# Patient Record
Sex: Male | Born: 1970
Health system: Southern US, Community
[De-identification: ages and names within clinical notes are randomized; demographics above are authoritative.]

## PROBLEM LIST (undated history)

## (undated) DIAGNOSIS — T7840XA Allergy, unspecified, initial encounter: Secondary | ICD-10-CM

## (undated) DIAGNOSIS — I4891 Unspecified atrial fibrillation: Secondary | ICD-10-CM

## (undated) DIAGNOSIS — C4491 Basal cell carcinoma of skin, unspecified: Secondary | ICD-10-CM

## (undated) DIAGNOSIS — K4090 Unilateral inguinal hernia, without obstruction or gangrene, not specified as recurrent: Secondary | ICD-10-CM

## (undated) DIAGNOSIS — E785 Hyperlipidemia, unspecified: Secondary | ICD-10-CM

## (undated) HISTORY — PX: BASAL CELL CARCINOMA EXCISION: SHX1214

## (undated) HISTORY — PX: WISDOM TOOTH EXTRACTION: SHX21

## (undated) HISTORY — DX: Basal cell carcinoma of skin, unspecified: C44.91

## (undated) HISTORY — DX: Unspecified atrial fibrillation: I48.91

## (undated) HISTORY — DX: Hyperlipidemia, unspecified: E78.5

## (undated) HISTORY — DX: Allergy, unspecified, initial encounter: T78.40XA

## (undated) HISTORY — DX: Unilateral inguinal hernia, without obstruction or gangrene, not specified as recurrent: K40.90

---

## 2004-06-07 ENCOUNTER — Ambulatory Visit: Payer: Self-pay | Admitting: Internal Medicine

## 2005-06-06 ENCOUNTER — Ambulatory Visit: Payer: Self-pay | Admitting: Internal Medicine

## 2005-10-04 ENCOUNTER — Ambulatory Visit: Payer: Self-pay | Admitting: Internal Medicine

## 2006-10-03 ENCOUNTER — Ambulatory Visit: Payer: Self-pay | Admitting: Internal Medicine

## 2007-01-19 ENCOUNTER — Encounter: Payer: Self-pay | Admitting: Internal Medicine

## 2007-01-19 ENCOUNTER — Ambulatory Visit: Payer: Self-pay | Admitting: Internal Medicine

## 2007-01-30 ENCOUNTER — Telehealth: Payer: Self-pay | Admitting: Internal Medicine

## 2007-01-31 ENCOUNTER — Ambulatory Visit: Payer: Self-pay | Admitting: Internal Medicine

## 2007-01-31 LAB — CONVERTED CEMR LAB
ALT: 14 units/L (ref 0–53)
AST: 17 units/L (ref 0–37)
Albumin: 4.1 g/dL (ref 3.5–5.2)
Alkaline Phosphatase: 44 units/L (ref 39–117)
BUN: 11 mg/dL (ref 6–23)
Basophils Absolute: 0 10*3/uL (ref 0.0–0.1)
Basophils Relative: 0.2 % (ref 0.0–1.0)
Bilirubin Urine: NEGATIVE
Bilirubin, Direct: 0.1 mg/dL (ref 0.0–0.3)
Blood in Urine, dipstick: NEGATIVE
CO2: 33 meq/L — ABNORMAL HIGH (ref 19–32)
Calcium: 9.3 mg/dL (ref 8.4–10.5)
Chloride: 104 meq/L (ref 96–112)
Cholesterol: 178 mg/dL (ref 0–200)
Creatinine, Ser: 0.8 mg/dL (ref 0.4–1.5)
Eosinophils Absolute: 0.2 10*3/uL (ref 0.0–0.6)
Eosinophils Relative: 4.4 % (ref 0.0–5.0)
GFR calc Af Amer: 141 mL/min
GFR calc non Af Amer: 116 mL/min
Glucose, Bld: 86 mg/dL (ref 70–99)
Glucose, Urine, Semiquant: NEGATIVE
HCT: 41.9 % (ref 39.0–52.0)
HDL: 51.3 mg/dL (ref >39.0)
Hemoglobin: 14.4 g/dL (ref 13.0–17.0)
Ketones, urine, test strip: NEGATIVE
LDL Cholesterol: 113 mg/dL — ABNORMAL HIGH (ref 0–99)
Lymphocytes Relative: 28.8 % (ref 12.0–46.0)
MCHC: 34.5 g/dL (ref 30.0–36.0)
MCV: 85.5 fL (ref 78.0–100.0)
Monocytes Absolute: 0.4 10*3/uL (ref 0.2–0.7)
Monocytes Relative: 7.6 % (ref 3.0–11.0)
Neutro Abs: 3.1 10*3/uL (ref 1.4–7.7)
Neutrophils Relative %: 59 % (ref 43.0–77.0)
Nitrite: NEGATIVE
Platelets: 288 10*3/uL (ref 150–400)
Potassium: 3.9 meq/L (ref 3.5–5.1)
Protein, U semiquant: NEGATIVE
RBC: 4.9 M/uL (ref 4.22–5.81)
RDW: 12.4 % (ref 11.5–14.6)
Sodium: 141 meq/L (ref 135–145)
Specific Gravity, Urine: 1.02
TSH: 0.93 microintl units/mL (ref 0.35–5.50)
Total Bilirubin: 0.6 mg/dL (ref 0.3–1.2)
Total CHOL/HDL Ratio: 3.5
Total Protein: 7 g/dL (ref 6.0–8.3)
Triglycerides: 69 mg/dL (ref 0–149)
Urobilinogen, UA: 0.2
VLDL: 14 mg/dL (ref 0–40)
WBC Urine, dipstick: NEGATIVE
WBC: 5.2 10*3/uL (ref 4.5–10.5)
pH: 6

## 2007-02-09 ENCOUNTER — Ambulatory Visit: Payer: Self-pay | Admitting: Internal Medicine

## 2008-02-02 ENCOUNTER — Telehealth: Payer: Self-pay | Admitting: Family Medicine

## 2008-04-02 ENCOUNTER — Ambulatory Visit: Payer: Self-pay | Admitting: Internal Medicine

## 2009-04-22 ENCOUNTER — Ambulatory Visit: Payer: Self-pay | Admitting: Internal Medicine

## 2009-06-03 ENCOUNTER — Telehealth: Payer: Self-pay | Admitting: Internal Medicine

## 2009-06-10 ENCOUNTER — Ambulatory Visit: Payer: Self-pay | Admitting: Internal Medicine

## 2009-06-10 DIAGNOSIS — R6882 Decreased libido: Secondary | ICD-10-CM | POA: Insufficient documentation

## 2009-06-16 LAB — CONVERTED CEMR LAB
AST: 21 units/L (ref 0–37)
Albumin: 4.3 g/dL (ref 3.5–5.2)
Basophils Absolute: 0 10*3/uL (ref 0.0–0.1)
CO2: 31 meq/L (ref 19–32)
Chloride: 105 meq/L (ref 96–112)
Eosinophils Absolute: 0.2 10*3/uL (ref 0.0–0.7)
Glucose, Bld: 87 mg/dL (ref 70–99)
Hemoglobin: 14.8 g/dL (ref 13.0–17.0)
Lymphocytes Relative: 34.7 % (ref 12.0–46.0)
Lymphs Abs: 1.7 10*3/uL (ref 0.7–4.0)
MCHC: 33.4 g/dL (ref 30.0–36.0)
Monocytes Relative: 8.2 % (ref 3.0–12.0)
Neutro Abs: 2.6 10*3/uL (ref 1.4–7.7)
Platelets: 235 10*3/uL (ref 150.0–400.0)
RDW: 12.2 % (ref 11.5–14.6)
Sodium: 142 meq/L (ref 135–145)
Total Bilirubin: 0.8 mg/dL (ref 0.3–1.2)

## 2009-06-17 ENCOUNTER — Telehealth: Payer: Self-pay | Admitting: Internal Medicine

## 2010-09-10 ENCOUNTER — Other Ambulatory Visit (INDEPENDENT_AMBULATORY_CARE_PROVIDER_SITE_OTHER): Payer: BC Managed Care – PPO | Admitting: Internal Medicine

## 2010-09-10 DIAGNOSIS — Z0000 Encounter for general adult medical examination without abnormal findings: Secondary | ICD-10-CM

## 2010-09-10 LAB — LIPID PANEL
Cholesterol: 179 mg/dL (ref 0–200)
Triglycerides: 43 mg/dL (ref 0.0–149.0)
VLDL: 8.6 mg/dL (ref 0.0–40.0)

## 2010-09-10 LAB — BASIC METABOLIC PANEL
BUN: 13 mg/dL (ref 6–23)
CO2: 30 mEq/L (ref 19–32)
Chloride: 105 mEq/L (ref 96–112)
Glucose, Bld: 88 mg/dL (ref 70–99)
Potassium: 4.6 mEq/L (ref 3.5–5.1)
Sodium: 142 mEq/L (ref 135–145)

## 2010-09-10 LAB — CBC WITH DIFFERENTIAL/PLATELET
Basophils Absolute: 0 10*3/uL (ref 0.0–0.1)
Eosinophils Absolute: 0.1 10*3/uL (ref 0.0–0.7)
HCT: 40.8 % (ref 39.0–52.0)
Hemoglobin: 14 g/dL (ref 13.0–17.0)
Lymphs Abs: 1.3 10*3/uL (ref 0.7–4.0)
MCHC: 34.3 g/dL (ref 30.0–36.0)
MCV: 87.7 fl (ref 78.0–100.0)
Monocytes Absolute: 0.5 10*3/uL (ref 0.1–1.0)
Monocytes Relative: 8.7 % (ref 3.0–12.0)
Neutro Abs: 4.2 10*3/uL (ref 1.4–7.7)
Platelets: 256 10*3/uL (ref 150.0–400.0)
RDW: 13.8 % (ref 11.5–14.6)

## 2010-09-10 LAB — HEPATIC FUNCTION PANEL
AST: 19 U/L (ref 0–37)
Albumin: 4.5 g/dL (ref 3.5–5.2)

## 2010-09-10 LAB — POCT URINALYSIS DIPSTICK
Blood, UA: NEGATIVE
Ketones, UA: NEGATIVE
Protein, UA: NEGATIVE
Spec Grav, UA: 1.01

## 2010-09-10 LAB — TSH: TSH: 0.93 u[IU]/mL (ref 0.35–5.50)

## 2010-09-23 ENCOUNTER — Encounter: Payer: Self-pay | Admitting: Internal Medicine

## 2010-09-27 ENCOUNTER — Encounter: Payer: Self-pay | Admitting: Internal Medicine

## 2010-09-27 ENCOUNTER — Ambulatory Visit (INDEPENDENT_AMBULATORY_CARE_PROVIDER_SITE_OTHER): Payer: BC Managed Care – PPO | Admitting: Internal Medicine

## 2010-09-27 VITALS — BP 122/84 | HR 72 | Temp 98.6°F | Ht 72.0 in | Wt 175.0 lb

## 2010-09-27 DIAGNOSIS — Z0000 Encounter for general adult medical examination without abnormal findings: Secondary | ICD-10-CM

## 2010-09-27 DIAGNOSIS — R6882 Decreased libido: Secondary | ICD-10-CM

## 2010-09-27 LAB — FOLLICLE STIMULATING HORMONE: FSH: 4.9 m[IU]/mL (ref 1.4–18.1)

## 2010-09-27 NOTE — Progress Notes (Signed)
  Subjective:    Patient ID: Casey Zavala, male    DOB: January 26, 1971, 40 y.o.   MRN: [id number]  HPI cpx  No past medical history on file. Past Surgical History  Procedure Date  . Basal cell carcinoma excision     reports that he has never smoked. He does not have any smokeless tobacco history on file. He reports that he drinks alcohol. He reports that he does not use illicit drugs. family history includes Depression in his sister; Diabetes in his brother and mother; Hypothyroidism in his mother; and Stroke in his father. No Known Allergies   Review of Systems  patient denies chest pain, shortness of breath, orthopnea. Denies lower extremity edema, abdominal pain, change in appetite, change in bowel movements. Patient denies rashes, musculoskeletal complaints. No other specific complaints in a complete review of systems.  Still has decreased libido.     Objective:   Physical Exam Well-developed male in no acute distress. HEENT exam atraumatic, normocephalic, extraocular muscles are intact. Conjunctivae are pink without exudate. Neck is supple without lymphadenopathy, thyromegaly, jugular venous distention. Chest is clear to auscultation without increased work of breathing. Cardiac exam S1-S2 are regular. The PMI is normal. No significant murmurs or gallops. Abdominal exam active bowel sounds, soft, nontender. No abdominal bruits. Extremities no clubbing cyanosis or edema. Peripheral pulses are normal without bruits. Neurologic exam alert and oriented without any motor or sensory deficits.  GU: nl male, circumcised. No hernia, testicular exam normal      Assessment & Plan:  Well visit Health maint UTD  Decreased libido---check labs today---testosterone previously normal

## 2011-05-31 ENCOUNTER — Ambulatory Visit (INDEPENDENT_AMBULATORY_CARE_PROVIDER_SITE_OTHER): Payer: BC Managed Care – PPO | Admitting: Internal Medicine

## 2011-05-31 DIAGNOSIS — Z23 Encounter for immunization: Secondary | ICD-10-CM

## 2012-12-03 ENCOUNTER — Other Ambulatory Visit (INDEPENDENT_AMBULATORY_CARE_PROVIDER_SITE_OTHER): Payer: BC Managed Care – PPO

## 2012-12-03 DIAGNOSIS — Z0000 Encounter for general adult medical examination without abnormal findings: Secondary | ICD-10-CM

## 2012-12-03 LAB — CBC WITH DIFFERENTIAL/PLATELET
Basophils Absolute: 0 10*3/uL (ref 0.0–0.1)
Hemoglobin: 13 g/dL (ref 13.0–17.0)
Lymphocytes Relative: 32.6 % (ref 12.0–46.0)
Monocytes Relative: 8.2 % (ref 3.0–12.0)
Platelets: 247 10*3/uL (ref 150.0–400.0)
RDW: 13.5 % (ref 11.5–14.6)

## 2012-12-03 LAB — POCT URINALYSIS DIPSTICK
Bilirubin, UA: NEGATIVE
Blood, UA: NEGATIVE
Glucose, UA: NEGATIVE
Spec Grav, UA: 1.015

## 2012-12-03 LAB — LIPID PANEL
HDL: 63.4 mg/dL (ref >39.00)
LDL Cholesterol: 117 mg/dL — ABNORMAL HIGH (ref 0–99)
Total CHOL/HDL Ratio: 3
Triglycerides: 53 mg/dL (ref 0.0–149.0)

## 2012-12-03 LAB — HEPATIC FUNCTION PANEL
AST: 17 U/L (ref 0–37)
Alkaline Phosphatase: 39 U/L (ref 39–117)
Bilirubin, Direct: 0 mg/dL (ref 0.0–0.3)
Total Bilirubin: 0.4 mg/dL (ref 0.3–1.2)

## 2012-12-03 LAB — BASIC METABOLIC PANEL
BUN: 12 mg/dL (ref 6–23)
Calcium: 9.2 mg/dL (ref 8.4–10.5)
GFR: 103.56 mL/min (ref >60.00)
Glucose, Bld: 85 mg/dL (ref 70–99)
Sodium: 139 mEq/L (ref 135–145)

## 2012-12-10 ENCOUNTER — Encounter: Payer: Self-pay | Admitting: Internal Medicine

## 2012-12-10 ENCOUNTER — Ambulatory Visit (INDEPENDENT_AMBULATORY_CARE_PROVIDER_SITE_OTHER): Payer: BC Managed Care – PPO | Admitting: Internal Medicine

## 2012-12-10 VITALS — BP 114/70 | HR 68 | Temp 98.1°F | Ht 71.5 in | Wt 172.0 lb

## 2012-12-10 DIAGNOSIS — Z0000 Encounter for general adult medical examination without abnormal findings: Secondary | ICD-10-CM

## 2012-12-10 MED ORDER — TRIAMCINOLONE ACETONIDE 0.5 % EX OINT: TOPICAL_OINTMENT | Freq: Two times a day (BID) | CUTANEOUS | Status: DC

## 2012-12-10 NOTE — Progress Notes (Signed)
Patient ID: Casey Zavala, male   DOB: 1970-07-26, 42 y.o.   MRN: [id number] cpx  No past medical history on file.  History   Social History  . Marital Status: Married    Spouse Name: N/A    Number of Children: N/A  . Years of Education: N/A   Occupational History  . Not on file.   Social History Main Topics  . Smoking status: Never Smoker   . Smokeless tobacco: Not on file  . Alcohol Use: Yes  . Drug Use: No  . Sexually Active: Not on file   Other Topics Concern  . Not on file   Social History Narrative  . No narrative on file    Past Surgical History  Procedure Laterality Date  . Basal cell carcinoma excision      Family History  Problem Relation Age of Onset  . Stroke Father   . Hypothyroidism Mother   . Diabetes Mother   . Depression Sister   . Diabetes Brother   . Schizophrenia Brother   . Kidney disease Brother     No Known Allergies  No current outpatient prescriptions on file prior to visit.   No current facility-administered medications on file prior to visit.     patient denies chest pain, shortness of breath, orthopnea. Denies lower extremity edema, abdominal pain, change in appetite, change in bowel movements. Patient denies rashes, musculoskeletal complaints. No other specific complaints in a complete review of systems.   BP 114/70  Pulse 68  Temp(Src) 98.1 F (36.7 C) (Oral)  Ht 5' 11.5" (1.816 m)  Wt 172 lb (78.019 kg)  BMI 23.66 kg/m2  well-developed well-nourished male in no acute distress. HEENT exam atraumatic, normocephalic, neck supple without jugular venous distention. Chest clear to auscultation cardiac exam S1-S2 are regular. Abdominal exam overweight with bowel sounds, soft and nontender. Extremities no edema. Neurologic exam is alert with a normal gait.  Well visit- health maint UTD

## 2013-10-31 ENCOUNTER — Telehealth: Payer: Self-pay | Admitting: Internal Medicine

## 2013-10-31 NOTE — Telephone Encounter (Signed)
Patient Information:  Caller Name: [name]  Phone: [phone]  Patient: Casey Zavala, Casey Zavala  Gender: Male  DOB: [birth year]  Age: 43 Years  PCP: Phoebe Sharps (Adults only)  Office Follow Up:  Does the office need to follow up with this patient?: No  Instructions For The Office: N/A  RN Note:  Poison Ivy, Neck, Forehead, Chin, Fingers, Torso, Arms, Waist, onset 5-10. Pt was in [place] over the w/e, thought rash/bumps were insect bites and has been scratching, rash spreading what sounds like Poison Ivy. No improvement after Triamcinolone x4 days. Poison Ivy protocol used, see today.  No appts at Office or [place] office, Pt does not wish to drive to any further locations.  Pt will go to Urgent Care in [facility] close to his employment.    Symptoms  Reason For Call & Symptoms: Poison Ivy, Neck, Forehead, Chin, Fingers, Torso, Arms, Waist, onset 5-10.  Reviewed Health History In EMR: Yes  Reviewed Medications In EMR: Yes  Reviewed Allergies In EMR: Yes  Reviewed Surgeries / Procedures: Yes  Date of Onset of Symptoms: 10/27/2013  Treatments Tried: Triamcinolone cream  Treatments Tried Worked: No  Guideline(s) Used:  Poison Ivy - [employer] or [employer]  Disposition Per Guideline:   See Today in Office  Reason For Disposition Reached:   Rash involves more than one fourth of the body  Advice Given:  N/A  Patient Will Follow Care Advice:  YES

## 2013-10-31 NOTE — Telephone Encounter (Signed)
Noted  

## 2013-11-20 ENCOUNTER — Encounter: Payer: Self-pay | Admitting: Family Medicine

## 2013-11-20 ENCOUNTER — Telehealth: Payer: Self-pay | Admitting: Internal Medicine

## 2013-11-20 ENCOUNTER — Ambulatory Visit (INDEPENDENT_AMBULATORY_CARE_PROVIDER_SITE_OTHER): Payer: BC Managed Care – PPO | Admitting: Family Medicine

## 2013-11-20 VITALS — BP 126/80 | HR 86 | Temp 98.5°F | Wt 178.0 lb

## 2013-11-20 DIAGNOSIS — T63441A Toxic effect of venom of bees, accidental (unintentional), initial encounter: Secondary | ICD-10-CM

## 2013-11-20 DIAGNOSIS — T6391XA Toxic effect of contact with unspecified venomous animal, accidental (unintentional), initial encounter: Secondary | ICD-10-CM

## 2013-11-20 DIAGNOSIS — T63461A Toxic effect of venom of wasps, accidental (unintentional), initial encounter: Secondary | ICD-10-CM

## 2013-11-20 MED ORDER — TRIAMCINOLONE ACETONIDE 0.5 % EX OINT: TOPICAL_OINTMENT | Freq: Two times a day (BID) | CUTANEOUS | Status: DC

## 2013-11-20 NOTE — Progress Notes (Signed)
Pre visit review using our clinic review tool, if applicable. No additional management support is needed unless otherwise documented below in the visit note. 

## 2013-11-20 NOTE — Telephone Encounter (Signed)
Noted  

## 2013-11-20 NOTE — Progress Notes (Signed)
   Subjective:    Patient ID: Casey Zavala, male    DOB: 1970/10/07, 43 y.o.   MRN: [id number]  HPI Acute visit. Wasp sting this past Monday. He had some localized redness and swelling initially but this had progressed by today. He was concerned about possible cellulitis. However, he has not had any fever or chills. No pain. Significant pruritus. Took a couple Benadryl. No icing. No generalized rash.  No past medical history on file. Past Surgical History  Procedure Laterality Date  . Basal cell carcinoma excision      reports that he has never smoked. He does not have any smokeless tobacco history on file. He reports that he drinks alcohol. He reports that he does not use illicit drugs. family history includes Depression in his sister; Diabetes in his brother and mother; Hypothyroidism in his mother; Kidney disease in his brother; Schizophrenia in his brother; Stroke in his father. No Known Allergies    Review of Systems  Constitutional: Negative for fever and chills.  Respiratory: Negative for shortness of breath.   Skin: Positive for rash.  Hematological: Negative for adenopathy.       Objective:   Physical Exam  Constitutional: He appears well-developed and well-nourished.  Cardiovascular: Normal rate.   Pulmonary/Chest: Effort normal and breath sounds normal.  Skin:  Patient has mild erythema involving flexor surface of left forearm most of forearm. This is nontender. Slightly warm to touch and slightly swollen.          Assessment & Plan:  Bee sting with localized allergic response. He does not have any fever, chills, or tenderness to suggest cellulitis. Continue Benadryl. Consider icing. Offered prednisone but he's had intolerance to person not to take systemic steroid

## 2013-11-20 NOTE — Patient Instructions (Signed)
Continue with antihistamine  Follow up for any fever, chills, or progressive redness or swelling- or any pain.

## 2013-11-20 NOTE — Telephone Encounter (Signed)
Patient Information:  Caller Name: [name]  Phone: [phone]  Patient: Casey Zavala, Casey Zavala  Gender: Male  DOB: [birth year]  Age: 43 Years  PCP: Phoebe Sharps (Adults only)  Office Follow Up:  Does the office need to follow up with this patient?: No  Instructions For The Office: N/A  RN Note:  Patient states he was stung by wasp on his left forearm 11/18/13. States redness and swelling have gradually increased. Patient states swelling and redness now extend from wrist to elbow 11/20/13. States area is warm to touch and feels tight. States he noted a "small blister," approx. the size of a pencil tip, at area of bite. States area opened in the shower 11/20/13. Denies purulent drainage. Afebrile. Care advice given per guidelines. Call back parameters reviewed. Patient verbalizes understanding. Appt. scheduled, per [place] in office,  at 1415 11/20/13 with Dr. Elease Hashimoto.  Symptoms  Reason For Call & Symptoms: Wasp sting Left forearm  Reviewed Health History In EMR: Yes  Reviewed Medications In EMR: Yes  Reviewed Allergies In EMR: Yes  Reviewed Surgeries / Procedures: Yes  Date of Onset of Symptoms: 11/18/2013  Treatments Tried: Benadryl  Treatments Tried Worked: No  Guideline(s) Used:  Clinical biochemist  Disposition Per Guideline:   See Today in Office  Reason For Disposition Reached:   Red or very tender (to touch) area, and started over 24 hours after the sting  Advice Given:  Antibiotic Ointment:  Apply an antibiotic ointment 3 times per day.  Patient Will Follow Care Advice:  YES  Appointment Scheduled:  11/20/2013 14:15:00 Appointment Scheduled Provider:  Carolann Littler [facility])

## 2014-01-17 ENCOUNTER — Encounter: Payer: Self-pay | Admitting: Family Medicine

## 2014-01-17 ENCOUNTER — Ambulatory Visit (INDEPENDENT_AMBULATORY_CARE_PROVIDER_SITE_OTHER): Payer: BC Managed Care – PPO | Admitting: Family Medicine

## 2014-01-17 VITALS — BP 108/80 | HR 93 | Temp 98.2°F | Ht 71.5 in | Wt 181.0 lb

## 2014-01-17 DIAGNOSIS — K4091 Unilateral inguinal hernia, without obstruction or gangrene, recurrent: Secondary | ICD-10-CM

## 2014-01-17 DIAGNOSIS — K4090 Unilateral inguinal hernia, without obstruction or gangrene, not specified as recurrent: Secondary | ICD-10-CM | POA: Insufficient documentation

## 2014-01-17 NOTE — Progress Notes (Signed)
Pre visit review using our clinic review tool, if applicable. No additional management support is needed unless otherwise documented below in the visit note. 

## 2014-01-17 NOTE — Progress Notes (Signed)
  Garret Reddish, MD Phone: [phone]  Subjective:   Casey Zavala is a 43 y.o. year old very pleasant male patient who presents with the following:  Left Groin Bulge Patient since April has noted some mild discomfort in his left groin. This past Sunday he was lifting something and felt a sudden discomfort and looked down and noted a bulge in his groin. Area located just to the left and slightly above his penis. He tried to push it in but could not get it to go. Later that day he noted the area was no longer bulging out. He has noted it a few times since then. Pain rated as a mild ache that improved when are no longer bulging . ROS- no changes in bowel or bladder. No fever/chills/redness. No nausea/vomiting. No pain with hip movement.   Past Medical History-previously healthy, mild hyperlipidemia with LDL 110-120.  Medications- no current meds   Objective: BP 108/80  Pulse 93  Temp(Src) 98.2 F (36.8 C) (Oral)  Ht 5' 11.5" (1.816 m)  Wt 181 lb (82.101 kg)  BMI 24.90 kg/m2  SpO2 96% Gen: NAD, resting comfortably on table CV: RRR no murmurs rubs or gallops Lungs: CTAB no crackles, wheeze, rhonchi Groin: no palpable bulge with increased abdominal pressure (bearing down or coughing)  Assessment/Plan:  Left groin hernia Could not palpate on exam today. History consistent with surgery. Discussed imaging vs. Surgery referral and patient would prefer surgery referral with their consideration of imaging after repeat exam. Discussed warning signs to present to care such as inability to reduce hernia or nausea/vomiting or bowel changes.    Orders Placed This Encounter  Procedures  . Ambulatory referral to General Surgery    Referral Priority:  Routine    Referral Type:  Surgical    Referral Reason:  Specialty Services Required    Requested Specialty:  General Surgery    Number of Visits Requested:  1

## 2014-01-17 NOTE — Patient Instructions (Signed)
I could not detect your hernia on exam today. We discussed CT through [place] and referral to surgery vs. Surgery referral and repeat exam as well as them deciding on imaging. Decision made to refer to surgery at this time. Our office will call you with appointment. I will clear you for surgery if need be but you have no risk factors for cardiac risk except mild cholesterol elevation.

## 2014-01-17 NOTE — Assessment & Plan Note (Signed)
Could not palpate on exam today. History consistent with surgery. Discussed imaging vs. Surgery referral and patient would prefer surgery referral with their consideration of imaging after repeat exam. Discussed warning signs to present to care such as inability to reduce hernia or nausea/vomiting or bowel changes.

## 2014-02-04 ENCOUNTER — Ambulatory Visit (INDEPENDENT_AMBULATORY_CARE_PROVIDER_SITE_OTHER): Payer: BC Managed Care – PPO | Admitting: General Surgery

## 2014-02-12 ENCOUNTER — Encounter (INDEPENDENT_AMBULATORY_CARE_PROVIDER_SITE_OTHER): Payer: Self-pay | Admitting: General Surgery

## 2014-02-12 ENCOUNTER — Ambulatory Visit (INDEPENDENT_AMBULATORY_CARE_PROVIDER_SITE_OTHER): Payer: BC Managed Care – PPO | Admitting: General Surgery

## 2014-02-12 VITALS — BP 122/80 | HR 74 | Temp 97.2°F | Ht 71.0 in | Wt 178.0 lb

## 2014-02-12 DIAGNOSIS — R109 Unspecified abdominal pain: Secondary | ICD-10-CM

## 2014-02-12 DIAGNOSIS — R1032 Left lower quadrant pain: Secondary | ICD-10-CM

## 2014-02-12 NOTE — Progress Notes (Signed)
Patient ID: Casey Zavala, male   DOB: 12-11-1970, 43 y.o.   MRN: [id number]  Chief Complaint  Patient presents with  . eval hernia    HPI Casey Zavala is a 43 y.o. male.  He the patient is a 43 year old male who is referred by Dr. Yong Channel for evaluation of left inguinal pain. The patient states that he noticed some discomfort his left inguinal area while lifting heavy things. He states he notes no pain while working out. He has noticed no bulge recently.  HPI  Past Medical History  Diagnosis Date  . Cancer     Past Surgical History  Procedure Laterality Date  . Basal cell carcinoma excision      Family History  Problem Relation Age of Onset  . Stroke Father   . Hypothyroidism Mother   . Diabetes Mother   . Depression Sister   . Diabetes Brother   . Schizophrenia Brother   . Kidney disease Brother     Social History History  Substance Use Topics  . Smoking status: Never Smoker   . Smokeless tobacco: Not on file  . Alcohol Use: Yes    No Known Allergies  Current Outpatient Prescriptions  Medication Sig Dispense Refill  . triamcinolone ointment (KENALOG) 0.5 % Apply topically 2 (two) times daily.  30 g  3   No current facility-administered medications for this visit.    Review of Systems Review of Systems  Constitutional: Negative.   HENT: Negative.   Eyes: Negative.   Respiratory: Negative.   Cardiovascular: Negative.   Gastrointestinal: Negative.   Endocrine: Negative.   Neurological: Negative.     Blood pressure 122/80, pulse 74, temperature 97.2 F (36.2 C), height 5\' 11"  (1.803 m), weight 178 lb (80.74 kg).  Physical Exam Physical Exam  Constitutional: He is oriented to person, place, and time. He appears well-developed and well-nourished.  HENT:  Head: Normocephalic and atraumatic.  Eyes: Conjunctivae and EOM are normal. Pupils are equal, round, and reactive to light.  Neck: Normal range of motion. Neck supple.  Cardiovascular: Normal  rate, regular rhythm and normal heart sounds.   Pulmonary/Chest: Effort normal and breath sounds normal.  Abdominal: Hernia confirmed negative in the right inguinal area (abdominal for weakness) and confirmed negative in the left inguinal area (Abdominal for weakness).  Musculoskeletal: Normal range of motion.  Neurological: He is alert and oriented to person, place, and time.  Skin: Skin is warm and dry.    Data Reviewed   Assessment    43 year old male with left inguinodynia, possible early left inguinal hernia/weakness of the hernia floor     Plan    1 I had a long discussion with the patient that there is no palpable artery this is felt on exam. This could be a very small hernia that is beginning. There is some weakness of the abdominal floor. I did discuss with the patient that some point the hernia would get bigger, and would like interfere with his last at that time we can discuss hernia repair. 2. Patient can resume normal activities.       Rosario Jacks., Reannon Candella 02/12/2014, 3:41 PM

## 2014-03-18 ENCOUNTER — Ambulatory Visit: Payer: BC Managed Care – PPO | Admitting: Family Medicine

## 2014-03-20 ENCOUNTER — Ambulatory Visit: Payer: BC Managed Care – PPO | Admitting: Family Medicine

## 2014-08-27 ENCOUNTER — Telehealth: Payer: Self-pay | Admitting: Family Medicine

## 2014-08-27 DIAGNOSIS — Z0000 Encounter for general adult medical examination without abnormal findings: Secondary | ICD-10-CM

## 2014-08-27 NOTE — Telephone Encounter (Signed)
LMOM for patient to call and schedule CPX.  Please see the "ok" from Dr. Yong Channel.  Patient will need the latest 2 appointment slots on the day scheduled. Thanks

## 2014-08-27 NOTE — Telephone Encounter (Signed)
Labs have been entered under [employer]

## 2014-08-27 NOTE — Telephone Encounter (Signed)
Patient would like a physical but needs it sooner than your next available.  Can I work him in on one of latest appointment slots??  He would also like to go to [facility] to have labs drawn, can you please enter the lab orders?

## 2014-08-27 NOTE — Telephone Encounter (Signed)
Yes can combine 2 slots for this.   Keba-needs CBC, CMET, TSH, lipids, UA under preventative health

## 2014-08-27 NOTE — Telephone Encounter (Signed)
See below

## 2014-08-29 ENCOUNTER — Other Ambulatory Visit (INDEPENDENT_AMBULATORY_CARE_PROVIDER_SITE_OTHER): Payer: BLUE CROSS/BLUE SHIELD

## 2014-08-29 DIAGNOSIS — Z0000 Encounter for general adult medical examination without abnormal findings: Secondary | ICD-10-CM

## 2014-08-29 LAB — COMPREHENSIVE METABOLIC PANEL
ALT: 13 U/L (ref 0–53)
AST: 18 U/L (ref 0–37)
Albumin: 4.5 g/dL (ref 3.5–5.2)
Alkaline Phosphatase: 48 U/L (ref 39–117)
BILIRUBIN TOTAL: 0.4 mg/dL (ref 0.2–1.2)
BUN: 18 mg/dL (ref 6–23)
CALCIUM: 9.5 mg/dL (ref 8.4–10.5)
CO2: 30 mEq/L (ref 19–32)
CREATININE: 0.97 mg/dL (ref 0.40–1.50)
Chloride: 104 mEq/L (ref 96–112)
GFR: 89.4 mL/min (ref >60.00)
Glucose, Bld: 93 mg/dL (ref 70–99)
Potassium: 4.7 mEq/L (ref 3.5–5.1)
Sodium: 138 mEq/L (ref 135–145)
Total Protein: 7.2 g/dL (ref 6.0–8.3)

## 2014-08-29 LAB — CBC
HCT: 39.8 % (ref 39.0–52.0)
HEMOGLOBIN: 13.5 g/dL (ref 13.0–17.0)
MCHC: 34 g/dL (ref 30.0–36.0)
MCV: 84.7 fl (ref 78.0–100.0)
Platelets: 279 10*3/uL (ref 150.0–400.0)
RBC: 4.7 Mil/uL (ref 4.22–5.81)
RDW: 13.2 % (ref 11.5–15.5)
WBC: 4.9 10*3/uL (ref 4.0–10.5)

## 2014-08-29 LAB — LIPID PANEL
Cholesterol: 189 mg/dL (ref 0–200)
HDL: 56.9 mg/dL (ref >39.00)
LDL CALC: 123 mg/dL — AB (ref 0–99)
NonHDL: 132.1
TRIGLYCERIDES: 44 mg/dL (ref 0.0–149.0)
Total CHOL/HDL Ratio: 3
VLDL: 8.8 mg/dL (ref 0.0–40.0)

## 2014-08-29 LAB — TSH: TSH: 1.11 u[IU]/mL (ref 0.35–4.50)

## 2014-09-05 ENCOUNTER — Ambulatory Visit (INDEPENDENT_AMBULATORY_CARE_PROVIDER_SITE_OTHER): Payer: BLUE CROSS/BLUE SHIELD | Admitting: Family Medicine

## 2014-09-05 VITALS — BP 120/82 | HR 97 | Temp 98.0°F | Wt 176.0 lb

## 2014-09-05 DIAGNOSIS — Z0000 Encounter for general adult medical examination without abnormal findings: Secondary | ICD-10-CM

## 2014-09-06 ENCOUNTER — Encounter: Payer: Self-pay | Admitting: Family Medicine

## 2014-09-06 DIAGNOSIS — I495 Sick sinus syndrome: Secondary | ICD-10-CM | POA: Insufficient documentation

## 2014-09-06 DIAGNOSIS — E785 Hyperlipidemia, unspecified: Secondary | ICD-10-CM | POA: Insufficient documentation

## 2014-09-06 NOTE — Assessment & Plan Note (Signed)
10 year risk 1.2% in 2016. No rx. Monitor at least every other year.

## 2014-09-06 NOTE — Progress Notes (Signed)
Casey Reddish, MD Phone: [phone]  Subjective:  Patient presents today for their annual physical. Chief complaint-noted.   His main concern today is a high resting heart rate. He is very active and exercising on a regular basis. He did have a small white spot in his mouth that was slightly itchy that has largely resolved except for abotu 1 cm fluid pocket as well.   ROS- full  review of systems was completed and negative. Pertinent negatives include no chest pain, shortness of breath, nausea, vomiting, left arm or neck pain. No palpitations.   The following were reviewed and entered/updated in epic: Past Medical History  Diagnosis Date  . Basal cell carcinoma of skin   . Left groin hernia     actually improved with exercise   Patient Active Problem List   Diagnosis Date Noted  . Left groin hernia 01/17/2014    Priority: Low  . Rapid resting heart rate 09/06/2014  . Hyperlipidemia 09/06/2014   Past Surgical History  Procedure Laterality Date  . Basal cell carcinoma excision      Family History  Problem Relation Age of Onset  . Stroke Father   . Hypothyroidism Mother   . Diabetes Mother   . Depression Sister   . Diabetes Brother   . Schizophrenia Brother   . Kidney disease Brother     Medications- no oral medications  Allergies-reviewed and updated No Known Allergies  History   Social History  . Marital Status: Married    Spouse Name: N/A  . Number of Children: N/A  . Years of Education: N/A   Social History Main Topics  . Smoking status: Never Smoker   . Smokeless tobacco: Not on file  . Alcohol Use: 0.0 oz/week    0 Standard drinks or equivalent per week     Comment: social mainly  . Drug Use: No  . Sexual Activity: Not on file   Other Topics Concern  . None   Social History Narrative   Married with 89 year old child in 2016.          Hobbies: exercising    ROS--See HPI   Objective: BP 120/82 mmHg  Pulse 97  Temp(Src) 98 F (36.7 C)   Wt 176 lb (79.833 kg) Gen: NAD, resting comfortably HEENT: Mucous membranes are moist. Oropharynx normal except for <1 cm fluid filled cyst beneath right lower lip. Nontender.  Neck: no thyromegaly, no lymphadenopathy CV: RRR no murmurs rubs or gallops Lungs: CTAB no crackles, wheeze, rhonchi Abdomen: soft/nontender/nondistended/normal bowel sounds. No rebound or guarding.  Ext: no edema, 2+ PT pulses Skin: warm, dry Neuro: grossly normal, moves all extremities, PERRLA  EKG: NSR rate 97, normal axis, normal intervals, no obvious hypertrophy, no st or t wave changes. Of note, had to use our old machine and v1 and v2 would not pick up so EKG limited .  Assessment/Plan:  44 y.o. male presenting for annual physical.  Health Maintenance counseling: 1. Anticipatory guidance: Patient counseled regarding regular dental exams, wearing seatbelts, wearing sunscreen. Needs to continue derm follow up with history basal cell.  2. Risk factor reduction:  Advised patient of need for regular exercise and diet rich and fruits and vegetables to reduce risk of heart attack and stroke.  3. Immunizations/screenings/ancillary studies Health Maintenance Due  Topic Date Due  . HIV Screening -low risk but reasonable to do with next set of labs.  09/27/1985   Left groin hernia Has not felt bulge in groin since exercisign  more. Has already seen surgery. Knows reasons for return.    Rapid resting heart rate Electrolytes and TSH normal. EKG shows rate in 90s but no obvious cause for this. Asymptomatic. We discussed could consider cardiology referral but thought likely not necessary as asymptomatic. Reassured patient. No calf pain or swelling to suggest DVT issues.    Hyperlipidemia 10 year risk 1.2% in 2016. No rx. Monitor at least every other year.    1-2 year follow up reasonable. Gave warning sings for spot in his mouth that seems to be shrinking in size-appears to be cyst. No oral tobacco history.

## 2014-09-06 NOTE — Assessment & Plan Note (Addendum)
Electrolytes and TSH normal. EKG shows rate in 90s but no obvious cause for this. Asymptomatic. We discussed could consider cardiology referral but thought likely not necessary as asymptomatic. Reassured patient. No calf pain or swelling to suggest DVT issues.

## 2014-09-06 NOTE — Assessment & Plan Note (Signed)
Has not felt bulge in groin since exercisign more. Has already seen surgery. Knows reasons for return.

## 2014-09-19 ENCOUNTER — Encounter: Payer: Self-pay | Admitting: Family Medicine

## 2014-10-10 ENCOUNTER — Encounter: Payer: Self-pay | Admitting: Family Medicine

## 2014-12-12 ENCOUNTER — Encounter: Payer: Self-pay | Admitting: Family Medicine

## 2014-12-12 ENCOUNTER — Ambulatory Visit (INDEPENDENT_AMBULATORY_CARE_PROVIDER_SITE_OTHER): Payer: BLUE CROSS/BLUE SHIELD | Admitting: Family Medicine

## 2014-12-12 VITALS — BP 130/84 | HR 103 | Temp 98.9°F | Wt 182.0 lb

## 2014-12-12 DIAGNOSIS — R001 Bradycardia, unspecified: Secondary | ICD-10-CM

## 2014-12-12 DIAGNOSIS — R000 Tachycardia, unspecified: Secondary | ICD-10-CM

## 2014-12-12 NOTE — Patient Instructions (Signed)
We will call you within a week about your referral to cardiology. If you do not hear within 2 weeks, give [place] a call.   I am not sure why your heart swings from the 50s to the 100s and seems to stay in 1 track for extended periods of time. I would like to have cardiology's opinion on this issue.

## 2014-12-12 NOTE — Progress Notes (Signed)
Garret Reddish, MD  Subjective:  Casey Zavala is a 44 y.o. year old very pleasant male patient who presents with:  Tachycardia and bradycardia At patient's physical we discussed patients high resting heart rate in the high 90s. This is despite patient being very athletic, exercising regular. For example, does Shaun T t-25 in January through recently, 3 weeks ago started with p90x 3 6 days a week. He also eats a healthy balanced diet which isUsually very limited on sweets. We did an EKG with rate 97 NSR and patient had normal electrolytes and TSH. EKG shows rate in 90s. There was no calf pain or swelling to suggest DVT issues. No obvious cause for high heart rate. Patient was Asymptomatic. We discussed could consider cardiology referral but thought likely not necessary as asymptomatic. Review of prior vitals showed2 of last 3 vistis with HR in 90s.   About a month later, patient updated me that his heart rate was running in the 40s and 50s. I encouraged him to come in for an EKG to further evaluate. He declined at the time but this morning updated me through mychart that HR had run in the 50s primarily for months and then recently went back up to mid 90s. At visit today, HR 103 initially and on my exam in the 90s.   ROS- patient continues to deny chest pain, shortness of breath, palpitations, nausea, vomiting, abnormal diaphoresis  Past Medical History- mild hyperlipidemia with 10 year risk <2% and statin not advised  Medications- reviewed and updated, no regular medication  Objective: BP 130/84 mmHg  Pulse 103  Temp(Src) 98.9 F (37.2 C)  Wt 182 lb (82.555 kg) Gen: NAD, resting comfortably CV: tachycardic but regular rate no murmurs rubs or gallops Lungs: CTAB no crackles, wheeze, rhonchi Abdomen: soft/nontender/nondistended/normal bowel sounds. No rebound or guarding.  Ext: no edema Skin: warm, dry Neuro: grossly normal, moves all extremities   Assessment/Plan:  Tachycardia and  bradycardia Odd alternating between HR in 90s and 100s to then down in the 50s. We will get a cardiology consult to further evaluate. My thought is that this is likely not life threatening but it is an odd shift in an athletic, fit 44 year old to go for months with HR in the 50s and then for periods with HR in 90s or 100s. Cardiology may simply offer reassurance but I would like their opinion on if there could be some tachy-brady syndrome, arhythmia and for consideration of cardiac monitoring as well as echo for structural eval of heart. I wish we had a chance to capture the bradycardia on EKG but unfortunately do not have that available. Discussed with patient that it may take 6-8 weeks to get into cardiology and to return if becomes symptomatic.   Orders Placed This Encounter  Procedures  . Ambulatory referral to Cardiology    Referral Priority:  Routine    Referral Type:  Consultation    Referral Reason:  Specialty Services Required    Requested Specialty:  Cardiology    Number of Visits Requested:  1

## 2015-01-30 ENCOUNTER — Ambulatory Visit (INDEPENDENT_AMBULATORY_CARE_PROVIDER_SITE_OTHER): Payer: BLUE CROSS/BLUE SHIELD | Admitting: Internal Medicine

## 2015-01-30 ENCOUNTER — Encounter: Payer: Self-pay | Admitting: Family Medicine

## 2015-01-30 ENCOUNTER — Encounter: Payer: Self-pay | Admitting: Internal Medicine

## 2015-01-30 VITALS — BP 110/76 | HR 88 | Ht 71.5 in | Wt 174.3 lb

## 2015-01-30 DIAGNOSIS — I495 Sick sinus syndrome: Secondary | ICD-10-CM | POA: Diagnosis not present

## 2015-01-30 DIAGNOSIS — R000 Tachycardia, unspecified: Secondary | ICD-10-CM

## 2015-01-30 NOTE — Patient Instructions (Addendum)
Your physician has requested that you have an exercise tolerance test at [facility]. For further information please visit [email or web address]. Please also follow instruction sheet, as given.  Your physician recommends that you schedule a follow-up appointment as needed - we will call you with your test results and/or release to MyChart   Exercise Stress Electrocardiogram An exercise stress electrocardiogram is a test to check how blood flows to your heart. It is done to find areas of poor blood flow. You will need to walk on a treadmill for this test. The electrocardiogram will record your heartbeat when you are at rest and when you are exercising. BEFORE THE PROCEDURE  Do not have drinks with caffeine or foods with caffeine for 24 hours before the test, or as told by your doctor. This includes coffee, tea (even decaf tea), sodas, chocolate, and cocoa.  Follow your doctor's instructions about eating and drinking before the test.  Ask your doctor what medicines you should or should not take before the test. Take your medicines with water unless told by your doctor not to.  If you use an inhaler, bring it with you to the test.  Bring a snack to eat after the test.  Do not  smoke for 4 hours before the test.  Do not put lotions, powders, creams, or oils on your chest before the test.  Wear comfortable shoes and clothing. PROCEDURE  You will have patches put on your chest. Small areas of your chest may need to be shaved. Wires will be connected to the patches.  Your heart rate will be watched while you are resting and while you are exercising.  You will walk on the treadmill. The treadmill will slowly get faster to raise your heart rate.  The test will take about 1-2 hours. AFTER THE PROCEDURE  Your heart rate and blood pressure will be watched after the test.  You may return to your normal diet, activities, and medicines or as told by your doctor. Document Released: 11/23/2007  Document Revised: 10/21/2013 Document Reviewed: 02/11/2013 [facility] Patient Information 2015 [place], [place]. This information is not intended to replace advice given to you by your health care provider. Make sure you discuss any questions you have with your health care provider.

## 2015-01-30 NOTE — Progress Notes (Signed)
OFFICE NOTE  Chief Complaint:  Unexplained tachycardia  Primary Care Physician: Garret Reddish, MD  HPI:  Casey Zavala is a pleasant 44 year old male who currently presents for evaluation of tachycardia with alternating tachycardia. Mr. Albanese is asymptomatic in remains active with regular exercise. He denies any chest pain or shortness of breath. He has no exercise intolerance. He's recently noted elevated heart rates for him which are in the 80s and 90s. Alternatively he has noted some heart rates in the 50s. EKG in the office today shows an ectopic atrial rhythm with short PR interval and mild junctional depression, heart rate 88. QTC is 450 ms. PR interval is 106 ms. He reports he is able to get his heart rate elevated with exercise. There is a family history of arrhythmia with his grandfather who had a pacemaker and father who apparently had "heart shocks" for a presumed arrhythmia.  PMHx:  Past Medical History  Diagnosis Date  . Basal cell carcinoma of skin   . Left groin hernia     actually improved with exercise    Past Surgical History  Procedure Laterality Date  . Basal cell carcinoma excision      FAMHx:  Family History  Problem Relation Age of Onset  . Stroke Father   . Hypothyroidism Mother   . Diabetes Mother   . Depression Sister   . Diabetes Brother   . Schizophrenia Brother   . Kidney disease Brother     SOCHx:   reports that he has never smoked. He does not have any smokeless tobacco history on file. He reports that he drinks alcohol. He reports that he does not use illicit drugs.  ALLERGIES:  No Known Allergies  ROS: A comprehensive review of systems was negative.  HOME MEDS: Current Outpatient Prescriptions  Medication Sig Dispense Refill  . aspirin EC 81 MG tablet Take 81 mg by mouth daily.    . Multiple Vitamin (MULTIVITAMIN) capsule Take 1 capsule by mouth daily.     No current facility-administered medications for this visit.     LABS/IMAGING: No results found for this or any previous visit (from the past 48 hour(s)). No results found.  WEIGHTS: Wt Readings from Last 3 Encounters:  01/30/15 174 lb 4.8 oz (79.062 kg)  12/12/14 182 lb (82.555 kg)  09/06/14 176 lb (79.833 kg)    VITALS: BP 110/76 mmHg  Pulse 88  Ht 5' 11.5" (1.816 m)  Wt 174 lb 4.8 oz (79.062 kg)  BMI 23.97 kg/m2  EXAM: General appearance: alert and no distress Neck: no carotid bruit and no JVD Lungs: clear to auscultation bilaterally Heart: regular rate and rhythm, S1, S2 normal, no murmur, click, rub or gallop Abdomen: soft, non-tender; bowel sounds normal; no masses,  no organomegaly Extremities: extremities normal, atraumatic, no cyanosis or edema Pulses: 2+ and symmetric Skin: Skin color, texture, turgor normal. No rashes or lesions Neurologic: Grossly normal Psych: Pleasant  EKG: Ectopic atrial rhythm with short PR interval, junctional ST depression  ASSESSMENT: 1. Probable sinus node dysfunction with ectopic atrial rhythm 2. Borderline short PR interval, asymptomatic  PLAN: 1.   Mr. Batten likely has sinus node dysfunction with EKG findings consistent with ectopic atrial rhythm. There is a borderline short PR interval. It may be that his heart rates in the 40s and 50s represent a junctional rhythm. The heart rate in the 80s is likely an ectopic atrial rhythm. Neither of which will likely cause him any harm since he's asymptomatic. These  could be related to ischemia however aren't are unlikely, especially given his lack of cardiac risk factors and his ability to exercise. I would like to obtain a plain exercise treadmill stress test, however to rule out ischemia and to evaluate for any arrhythmias that may develop or evidence for chronotropic competence. There is no clear indication for pacemaker, however should he become symptomatic or develop worsening sinus node dysfunction this may be a possibility in the future. There  also is likely increased risk for atrial fibrillation and this should be on our radar screen.  Thanks as always for the kind referral. We'll contact him with results of his treadmill stress test the next few weeks.  Pixie Casino, MD, [facility] Attending Cardiologist [place] 01/30/2015, 8:41 AM

## 2015-02-13 ENCOUNTER — Telehealth (HOSPITAL_COMMUNITY): Payer: Self-pay

## 2015-02-13 NOTE — Telephone Encounter (Signed)
Encounter complete. 

## 2015-02-18 ENCOUNTER — Ambulatory Visit (HOSPITAL_COMMUNITY)
Admission: RE | Admit: 2015-02-18 | Discharge: 2015-02-18 | Disposition: A | Discharge status: Home / Self Care | Payer: BLUE CROSS/BLUE SHIELD | Source: Ambulatory Visit | Attending: Cardiovascular Disease | Admitting: Cardiovascular Disease

## 2015-02-18 DIAGNOSIS — I495 Sick sinus syndrome: Secondary | ICD-10-CM | POA: Diagnosis not present

## 2015-02-18 DIAGNOSIS — R000 Tachycardia, unspecified: Secondary | ICD-10-CM | POA: Diagnosis not present

## 2015-02-18 LAB — EXERCISE TOLERANCE TEST
CHL CUP MPHR: 176 {beats}/min
CHL CUP RESTING HR STRESS: 97 {beats}/min
CHL RATE OF PERCEIVED EXERTION: 16
CSEPHR: 104 %
Estimated workload: 17.2 METS
Exercise duration (min): 15 min
Peak HR: 184 {beats}/min

## 2015-07-16 ENCOUNTER — Telehealth: Payer: Self-pay | Admitting: Internal Medicine

## 2015-07-16 NOTE — Telephone Encounter (Signed)
Returned call to patient.Stated he is worried about elevated heart rate.Stated heart rate ranging low 90's to low 100's.Stated he avoids caffeine and tries to eat healthy.Stated he is a anxious person.Stated he wanted to ask Dr.Hilty his advice.Message sent to Dr.Hilty.

## 2015-07-16 NOTE — Telephone Encounter (Signed)
Returned call to patient.Dr.Hilty advised anxiety can increase heart rate.Advised to call back if heart rate continues to be elevated.

## 2015-07-16 NOTE — Telephone Encounter (Signed)
New Message  Pt requested to speak w/ RN before coming in for appt- to discuss recent palpitations. Please call back and discuss.

## 2015-07-16 NOTE — Telephone Encounter (Signed)
Anxiety can increase the heartrate - don't necessarily need to take medicine to slow it down.  Dr. Lemmie Evens

## 2016-01-26 DIAGNOSIS — Z85828 Personal history of other malignant neoplasm of skin: Secondary | ICD-10-CM | POA: Diagnosis not present

## 2016-01-26 DIAGNOSIS — D225 Melanocytic nevi of trunk: Secondary | ICD-10-CM | POA: Diagnosis not present

## 2016-01-26 DIAGNOSIS — L570 Actinic keratosis: Secondary | ICD-10-CM | POA: Diagnosis not present

## 2016-01-26 DIAGNOSIS — L738 Other specified follicular disorders: Secondary | ICD-10-CM | POA: Diagnosis not present

## 2016-02-18 DIAGNOSIS — H5213 Myopia, bilateral: Secondary | ICD-10-CM | POA: Diagnosis not present

## 2016-03-15 DIAGNOSIS — H524 Presbyopia: Secondary | ICD-10-CM | POA: Diagnosis not present

## 2016-06-22 DIAGNOSIS — L570 Actinic keratosis: Secondary | ICD-10-CM | POA: Diagnosis not present

## 2016-06-22 DIAGNOSIS — L821 Other seborrheic keratosis: Secondary | ICD-10-CM | POA: Diagnosis not present

## 2016-09-01 ENCOUNTER — Telehealth: Payer: Self-pay | Admitting: Family Medicine

## 2016-09-01 MED ORDER — BUSPIRONE HCL 5 MG PO TABS: 5.0000 mg | ORAL_TABLET | Freq: Two times a day (BID) | ORAL | 0 refills | Status: DC | PRN

## 2016-09-01 NOTE — Telephone Encounter (Signed)
Called and left a detailed voicemail message for patient and instructed him to call the office and schedule an appointment for a physical.

## 2016-09-01 NOTE — Telephone Encounter (Signed)
I sent in buspirone for him. Suggest he trials before he trials before sleep to make sure he tolerates it.   He also needs to schedule a CPE- has been 2 years. Please inform

## 2016-09-01 NOTE — Telephone Encounter (Signed)
° ° ° °  Pt call to say he has a problem with flying and he is going on a trip next Friday and is asking for a RX of something to take the edge off .    Ph[facility]acy [facility] [address]

## 2016-09-27 DIAGNOSIS — L821 Other seborrheic keratosis: Secondary | ICD-10-CM | POA: Diagnosis not present

## 2016-09-27 DIAGNOSIS — Z85828 Personal history of other malignant neoplasm of skin: Secondary | ICD-10-CM | POA: Diagnosis not present

## 2016-09-27 DIAGNOSIS — L720 Epidermal cyst: Secondary | ICD-10-CM | POA: Diagnosis not present

## 2016-09-27 DIAGNOSIS — L718 Other rosacea: Secondary | ICD-10-CM | POA: Diagnosis not present

## 2017-01-19 DIAGNOSIS — Z85828 Personal history of other malignant neoplasm of skin: Secondary | ICD-10-CM | POA: Diagnosis not present

## 2017-01-19 DIAGNOSIS — D225 Melanocytic nevi of trunk: Secondary | ICD-10-CM | POA: Diagnosis not present

## 2017-01-19 DIAGNOSIS — D485 Neoplasm of uncertain behavior of skin: Secondary | ICD-10-CM | POA: Diagnosis not present

## 2017-01-19 DIAGNOSIS — L821 Other seborrheic keratosis: Secondary | ICD-10-CM | POA: Diagnosis not present

## 2017-02-23 DIAGNOSIS — H5213 Myopia, bilateral: Secondary | ICD-10-CM | POA: Diagnosis not present

## 2017-04-30 DIAGNOSIS — H5213 Myopia, bilateral: Secondary | ICD-10-CM | POA: Diagnosis not present

## 2017-05-17 ENCOUNTER — Ambulatory Visit (INDEPENDENT_AMBULATORY_CARE_PROVIDER_SITE_OTHER): Payer: BLUE CROSS/BLUE SHIELD | Admitting: Family Medicine

## 2017-05-17 ENCOUNTER — Encounter: Payer: Self-pay | Admitting: Family Medicine

## 2017-05-17 VITALS — BP 136/86 | HR 65 | Temp 97.9°F | Ht 71.5 in | Wt 174.6 lb

## 2017-05-17 DIAGNOSIS — Z23 Encounter for immunization: Secondary | ICD-10-CM

## 2017-05-17 DIAGNOSIS — Z0000 Encounter for general adult medical examination without abnormal findings: Secondary | ICD-10-CM | POA: Diagnosis not present

## 2017-05-17 DIAGNOSIS — E785 Hyperlipidemia, unspecified: Secondary | ICD-10-CM

## 2017-05-17 LAB — COMPREHENSIVE METABOLIC PANEL
ALT: 11 U/L (ref 0–53)
AST: 14 U/L (ref 0–37)
Albumin: 4.8 g/dL (ref 3.5–5.2)
Alkaline Phosphatase: 51 U/L (ref 39–117)
BUN: 14 mg/dL (ref 6–23)
CHLORIDE: 101 meq/L (ref 96–112)
CO2: 31 mEq/L (ref 19–32)
Calcium: 9.8 mg/dL (ref 8.4–10.5)
Creatinine, Ser: 0.94 mg/dL (ref 0.40–1.50)
GFR: 91.57 mL/min (ref >60.00)
GLUCOSE: 101 mg/dL — AB (ref 70–99)
Potassium: 4.4 mEq/L (ref 3.5–5.1)
SODIUM: 139 meq/L (ref 135–145)
Total Bilirubin: 0.6 mg/dL (ref 0.2–1.2)
Total Protein: 7.5 g/dL (ref 6.0–8.3)

## 2017-05-17 LAB — CBC
HEMATOCRIT: 44.7 % (ref 39.0–52.0)
Hemoglobin: 14.6 g/dL (ref 13.0–17.0)
MCHC: 32.6 g/dL (ref 30.0–36.0)
MCV: 87.7 fl (ref 78.0–100.0)
Platelets: 324 10*3/uL (ref 150.0–400.0)
RBC: 5.09 Mil/uL (ref 4.22–5.81)
RDW: 13.5 % (ref 11.5–15.5)
WBC: 5.4 10*3/uL (ref 4.0–10.5)

## 2017-05-17 LAB — LIPID PANEL
CHOL/HDL RATIO: 3
CHOLESTEROL: 226 mg/dL — AB (ref 0–200)
HDL: 67.4 mg/dL (ref >39.00)
LDL CALC: 145 mg/dL — AB (ref 0–99)
NonHDL: 158.11
TRIGLYCERIDES: 67 mg/dL (ref 0.0–149.0)
VLDL: 13.4 mg/dL (ref 0.0–40.0)

## 2017-05-17 NOTE — Addendum Note (Signed)
Addended by: Mariam Dollar, Roselyn Reef M on: 05/17/2017 10:30 AM   Modules accepted: Orders

## 2017-05-17 NOTE — Patient Instructions (Signed)
Please stop by lab before you go  Thanks for getting flu shot previously and getting Tdap today- this is good for 10 years

## 2017-05-17 NOTE — Progress Notes (Addendum)
Phone: [phone]  Subjective:  Patient presents today for their annual physical. Chief complaint-noted.   See problem oriented charting- ROS- full  review of systems was completed and negative except for: occasional palpitations  The following were reviewed and entered/updated in epic: Past Medical History:  Diagnosis Date  . Basal cell carcinoma of skin   . Left groin hernia    actually improved with exercise   Patient Active Problem List   Diagnosis Date Noted  . Sinus node dysfunction ([place]) 09/06/2014    Priority: Low  . Hyperlipidemia 09/06/2014    Priority: Low  . Left groin hernia 01/17/2014    Priority: Low   Past Surgical History:  Procedure Laterality Date  . BASAL CELL CARCINOMA EXCISION      Family History  Problem Relation Age of Onset  . Stroke Father   . Hypothyroidism Mother        also dementia  . Diabetes Mother   . Depression Sister   . Diabetes Brother   . Schizophrenia Brother   . Kidney disease Brother   . Diabetes Maternal Grandmother   . Dementia Maternal Grandfather   . Heart attack Paternal Grandmother   . Lung cancer Paternal Grandfather     Medications- reviewed and updated Current Outpatient Medications  Medication Sig Dispense Refill  . busPIRone (BUSPAR) 5 MG tablet Take 1 tablet (5 mg total) by mouth 2 (two) times daily as needed (anxiety with travel). (Patient not taking: Reported on 05/17/2017) 10 tablet 0   No current facility-administered medications for this visit.     Allergies-reviewed and updated No Known Allergies  Social History   Socioeconomic History  . Marital status: Married    Spouse name: None  . Number of children: 1  . Years of education: college  . Highest education level: None  Social Needs  . Financial resource strain: None  . Food insecurity - worry: None  . Food insecurity - inability: None  . Transportation needs - medical: None  . Transportation needs - non-medical: None  Occupational  History  . Occupation: Lexicographer: [employer]  Tobacco Use  . Smoking status: Never Smoker  . Smokeless tobacco: Never Used  Substance and Sexual Activity  . Alcohol use: Yes    Alcohol/week: 1.2 oz    Types: 2 Standard drinks or equivalent per week    Comment: social mainly  . Drug use: No  . Sexual activity: None  Other Topics Concern  . None  Social History Narrative   Married with 50 year old child in 2016.       [employer], broadcasting/film/video background      Hobbies: exercising    Objective: BP 136/86 (BP Location: Left Arm, Patient Position: Sitting, Cuff Size: Large)   Pulse 65   Temp 97.9 F (36.6 C) (Oral)   Ht 5' 11.5" (1.816 m)   Wt 174 lb 9.6 oz (79.2 kg)   SpO2 98%   BMI 24.01 kg/m  Gen: NAD, resting comfortably HEENT: Mucous membranes are moist. Oropharynx normal Neck: no thyromegaly CV: RRR no murmurs rubs or gallops Lungs: CTAB no crackles, wheeze, rhonchi Abdomen: soft/nontender/nondistended/normal bowel sounds. No rebound or guarding.  Ext: no edema Skin: warm, dry Neuro: grossly normal, moves all extremities, PERRLA  Defer rectal until at least age 53 unless family history  Assessment/Plan:  46 y.o. male presenting for annual physical.  Health Maintenance counseling: 1. Anticipatory guidance: Patient counseled regarding regular  dental exams -q6 months, eye exams -yearly, wearing seatbelts.  2. Risk factor reduction:  Advised patient of need for regular exercise and diet rich and fruits and vegetables to reduce risk of heart attack and stroke. Exercise- up and down- started 3 months ago. Diet-journaling his eating, minimal alcohol.  Wt Readings from Last 3 Encounters:  05/17/17 174 lb 9.6 oz (79.2 kg)  01/30/15 174 lb 4.8 oz (79.1 kg)  12/12/14 182 lb (82.6 kg)  3. Immunizations/screenings/ancillary studies- Tdap today and flu shot at work. Discussed HIV screen- done 11 years ago before adoption.  Immunization  History  Administered Date(s) Administered  . Influenza Split 05/31/2011  . Influenza Whole 04/02/2008, 04/22/2009  . Influenza-Unspecified 04/20/2017  . Td 06/20/1996, 02/09/2007  4. Prostate cancer screening- no family history, start at age 6-55. Prostatitis years ago and saw urology  5. Colon cancer screening - no family history, start at age 96-50. Hold to 76 unless insurance clearly covers at younger age in this scale.  6. Skin cancer screening/prevention- history of basal cell skin cancer-seeing Dr. Wilhemina Bonito [place] derm- advised regular sunscreen use- missing some- at least wearing hat. Denies worrisome, changing, or new skin lesions.  7. Testicular cancer screening- advised monthly self exams  8. STD screening- patient opts out- monogomous  Status of chronic or acute concerns   Left groin hernia- has seen surgeon and monitoring only for now. Has not been bothering him at all  High resting heart rate in 90s previously- down to 60s now. Seems to very from 65s up to 90s- has seen Dr. Debara Pickett- reassuring stress test. Thought sinus node dysfunction with ectopic atrial rhythm as well.   HLD- update lipids- very mild elevatoins and doubt statin will be needed shortly.   Return in about 1 year (around 05/17/2018).  Tdap today Orders Placed This Encounter  Procedures  . CBC    [place]  . Comprehensive metabolic panel    [place]    Order Specific Question:   Has the patient fasted?    Answer:   No  . Lipid panel    [place]    Order Specific Question:   Has the patient fasted?    Answer:   No   Return precautions advised.  Garret Reddish, MD

## 2017-05-18 ENCOUNTER — Encounter: Payer: Self-pay | Admitting: Family Medicine

## 2017-09-13 ENCOUNTER — Other Ambulatory Visit: Payer: Self-pay | Admitting: Family Medicine

## 2017-09-13 MED ORDER — BUSPIRONE HCL 5 MG PO TABS: 5.0000 mg | ORAL_TABLET | Freq: Two times a day (BID) | ORAL | 0 refills | Status: DC | PRN

## 2017-10-19 ENCOUNTER — Encounter: Payer: Self-pay | Admitting: Family Medicine

## 2017-10-19 DIAGNOSIS — L821 Other seborrheic keratosis: Secondary | ICD-10-CM | POA: Diagnosis not present

## 2017-10-19 DIAGNOSIS — Z85828 Personal history of other malignant neoplasm of skin: Secondary | ICD-10-CM | POA: Diagnosis not present

## 2017-10-19 DIAGNOSIS — C44319 Basal cell carcinoma of skin of other parts of face: Secondary | ICD-10-CM | POA: Diagnosis not present

## 2017-10-31 ENCOUNTER — Encounter: Payer: Self-pay | Admitting: Family Medicine

## 2017-10-31 DIAGNOSIS — Z85828 Personal history of other malignant neoplasm of skin: Secondary | ICD-10-CM | POA: Insufficient documentation

## 2017-12-13 DIAGNOSIS — C44319 Basal cell carcinoma of skin of other parts of face: Secondary | ICD-10-CM | POA: Diagnosis not present

## 2018-02-26 DIAGNOSIS — H5213 Myopia, bilateral: Secondary | ICD-10-CM | POA: Diagnosis not present

## 2018-02-26 DIAGNOSIS — H524 Presbyopia: Secondary | ICD-10-CM | POA: Diagnosis not present

## 2018-03-20 ENCOUNTER — Ambulatory Visit: Payer: BLUE CROSS/BLUE SHIELD | Admitting: Nurse Practitioner

## 2018-03-20 VITALS — BP 118/90 | HR 107 | Resp 16 | Ht 72.0 in | Wt 182.0 lb

## 2018-03-20 NOTE — Patient Instructions (Addendum)
If the cyst becomes painful or you develop numbness/tingling follow up for further eval and treat Consider wearing a wrist sleeve to support the area when working out/doing vigorous wrist exercised Cut back on salt and read labels in salsa/chips. Try to stick to 2gm/day sodium intake Please back into exercising routinely Last weight and labs at PCP office during wellness discussed today RTC 2 weeks for repeat blood pressure and will check fasting blood glucose; instructed to come fasting

## 2018-03-20 NOTE — Progress Notes (Signed)
   Subjective:    Patient ID: Casey Zavala, male    DOB: 03/10/1971, 47 y.o.   MRN: [id number]  HPI Dionne Bucy is here today at the worksite clinic with c/o a left wrist nodule that has been present for months but getting bigger. Denies any discomfort or pain to the area except at times when he's put pressure on it after doing "push ups".   Discussed eating habits today with slight elevation in blood pressure and reports eats half container of salsa with chips at one sitting and does this frequently. Also, has stopped working out like he usually does.  Has long time hx of higher HR and has seen cardiology some time ago and no c/o c/p, SOB or palpitations.  Review of Systems  Constitutional: Positive for activity change. Negative for appetite change and unexpected weight change.  Cardiovascular: Negative for chest pain, palpitations and leg swelling.  Skin: Negative for color change.  Neurological: Negative for weakness and numbness.       No tingling of left wrist       Objective:   Physical Exam  Constitutional: He is oriented to person, place, and time. He appears well-developed and well-nourished.  HENT:  Head: Normocephalic.  Musculoskeletal: Normal range of motion. He exhibits no edema, tenderness or deformity.  Left wrist nodule to the volar aspect of wrist with normal skin pigmentation  Neurological: He is alert and oriented to person, place, and time.  Skin: Skin is warm and dry. No erythema.  No edema           Assessment & Plan:

## 2018-04-24 DIAGNOSIS — C44719 Basal cell carcinoma of skin of left lower limb, including hip: Secondary | ICD-10-CM | POA: Diagnosis not present

## 2018-04-24 DIAGNOSIS — Z85828 Personal history of other malignant neoplasm of skin: Secondary | ICD-10-CM | POA: Diagnosis not present

## 2018-04-24 DIAGNOSIS — L820 Inflamed seborrheic keratosis: Secondary | ICD-10-CM | POA: Diagnosis not present

## 2018-04-24 DIAGNOSIS — L570 Actinic keratosis: Secondary | ICD-10-CM | POA: Diagnosis not present

## 2018-04-24 DIAGNOSIS — C44619 Basal cell carcinoma of skin of left upper limb, including shoulder: Secondary | ICD-10-CM | POA: Diagnosis not present

## 2018-04-24 DIAGNOSIS — L821 Other seborrheic keratosis: Secondary | ICD-10-CM | POA: Diagnosis not present

## 2018-05-21 ENCOUNTER — Ambulatory Visit (INDEPENDENT_AMBULATORY_CARE_PROVIDER_SITE_OTHER): Payer: BLUE CROSS/BLUE SHIELD | Admitting: Family Medicine

## 2018-05-21 ENCOUNTER — Encounter: Payer: Self-pay | Admitting: Family Medicine

## 2018-05-21 VITALS — BP 118/84 | HR 100 | Temp 98.4°F | Ht 72.0 in | Wt 179.8 lb

## 2018-05-21 DIAGNOSIS — E785 Hyperlipidemia, unspecified: Secondary | ICD-10-CM

## 2018-05-21 DIAGNOSIS — Z0000 Encounter for general adult medical examination without abnormal findings: Secondary | ICD-10-CM | POA: Diagnosis not present

## 2018-05-21 LAB — CBC
HCT: 45.6 % (ref 39.0–52.0)
Hemoglobin: 15.5 g/dL (ref 13.0–17.0)
MCHC: 34 g/dL (ref 30.0–36.0)
MCV: 86.8 fl (ref 78.0–100.0)
PLATELETS: 320 10*3/uL (ref 150.0–400.0)
RBC: 5.25 Mil/uL (ref 4.22–5.81)
RDW: 13.1 % (ref 11.5–15.5)
WBC: 6.4 10*3/uL (ref 4.0–10.5)

## 2018-05-21 LAB — COMPREHENSIVE METABOLIC PANEL
ALBUMIN: 4.8 g/dL (ref 3.5–5.2)
ALT: 13 U/L (ref 0–53)
AST: 13 U/L (ref 0–37)
Alkaline Phosphatase: 52 U/L (ref 39–117)
BILIRUBIN TOTAL: 0.4 mg/dL (ref 0.2–1.2)
BUN: 14 mg/dL (ref 6–23)
CALCIUM: 10 mg/dL (ref 8.4–10.5)
CO2: 33 meq/L — AB (ref 19–32)
CREATININE: 0.93 mg/dL (ref 0.40–1.50)
Chloride: 100 mEq/L (ref 96–112)
GFR: 92.31 mL/min (ref >60.00)
Glucose, Bld: 100 mg/dL — ABNORMAL HIGH (ref 70–99)
Potassium: 4.5 mEq/L (ref 3.5–5.1)
Sodium: 140 mEq/L (ref 135–145)
TOTAL PROTEIN: 7.6 g/dL (ref 6.0–8.3)

## 2018-05-21 LAB — LIPID PANEL
CHOL/HDL RATIO: 4
Cholesterol: 251 mg/dL — ABNORMAL HIGH (ref 0–200)
HDL: 65 mg/dL (ref >39.00)
LDL Cholesterol: 157 mg/dL — ABNORMAL HIGH (ref 0–99)
NonHDL: 186.05
Triglycerides: 144 mg/dL (ref 0.0–149.0)
VLDL: 28.8 mg/dL (ref 0.0–40.0)

## 2018-05-21 MED ORDER — BUSPIRONE HCL 5 MG PO TABS: 5.0000 mg | ORAL_TABLET | Freq: Two times a day (BID) | ORAL | 0 refills | Status: DC | PRN

## 2018-05-21 NOTE — Patient Instructions (Addendum)
Please stop by lab before you go  I like your goal of staying in low 170s.

## 2018-05-21 NOTE — Addendum Note (Signed)
Addended by: Kayren Eaves T on: 05/21/2018 08:41 AM   Modules accepted: Orders

## 2018-05-21 NOTE — Progress Notes (Signed)
Phone: [phone]  Subjective:  Patient presents today for their annual physical. Chief complaint-noted.   See problem oriented charting- ROS- full  review of systems was completed and negative including No chest pain or shortness of breath. No headache or blurry vision.    The following were reviewed and entered/updated in epic: Past Medical History:  Diagnosis Date  . Basal cell carcinoma of skin   . Left groin hernia    actually improved with exercise   Patient Active Problem List   Diagnosis Date Noted  . History of skin cancer 10/31/2017    Priority: Low  . Sinus node dysfunction ([place]) 09/06/2014    Priority: Low  . Hyperlipidemia 09/06/2014    Priority: Low  . Left groin hernia 01/17/2014    Priority: Low   Past Surgical History:  Procedure Laterality Date  . BASAL CELL CARCINOMA EXCISION      Family History  Problem Relation Age of Onset  . Stroke Father   . Hypothyroidism Mother        also dementia  . Diabetes Mother   . Depression Sister   . Diabetes Brother   . Schizophrenia Brother   . Kidney disease Brother   . Diabetes Maternal Grandmother   . Dementia Maternal Grandfather   . Heart attack Paternal Grandmother   . Lung cancer Paternal Grandfather     Medications- reviewed and updated Current Outpatient Medications  Medication Sig Dispense Refill  . busPIRone (BUSPAR) 5 MG tablet Take 1 tablet (5 mg total) by mouth 2 (two) times daily as needed (anxiety with travel). 10 tablet 0   Allergies-reviewed and updated No Known Allergies  Social History   Social History Narrative   Married with 87 year old child in 2016.       [employer], broadcasting/film/video background      Hobbies: exercising   Objective: BP 118/84 (BP Location: Left Arm, Patient Position: Sitting, Cuff Size: Large)   Pulse 100   Temp 98.4 F (36.9 C) (Oral)   Ht 6' (1.829 m)   Wt 179 lb 12.8 oz (81.6 kg)   SpO2 98%   BMI 24.39 kg/m  Gen: NAD, resting  comfortably HEENT: Mucous membranes are moist. Oropharynx normal Neck: no thyromegaly CV: RRR no murmurs rubs or gallops Lungs: CTAB no crackles, wheeze, rhonchi Abdomen: soft/nontender/nondistended/normal bowel sounds. No rebound or guarding.  Ext: no edema Skin: warm, dry, scar on left forearm from recent procedure for skin cancer, some mild erythema on face after recent 5 fluorouracil treatment Neuro: grossly normal, moves all extremities, PERRLA  Assessment/Plan:  47 y.o. male presenting for annual physical.  Health Maintenance counseling: 1. Anticipatory guidance: Patient counseled regarding regular dental exams -q6 months, eye exams -yearly,  avoiding smoking and second hand smoke, limiting alcohol to 2 beverages per day - 2 a week right now.   2. Risk factor reduction:  Advised patient of need for regular exercise and diet rich and fruits and vegetables to reduce risk of heart attack and stroke. Exercise- doing p90x 3- did for 6 weeks and then work made it tough- is going to try to get back on this or something similar. Diet-he would like 174-175- 176 this Am on home scales after thanksgiving.  Wt Readings from Last 3 Encounters:  05/21/18 179 lb 12.8 oz (81.6 kg)  03/20/18 182 lb (82.6 kg)  05/17/17 174 lb 9.6 oz (79.2 kg)  3. Immunizations/screenings/ancillary studies-flu shot given at work  Immunization History  Administered Date(s) Administered  . Influenza Split 05/31/2011  . Influenza Whole 04/02/2008, 04/22/2009  . Influenza-Unspecified 04/20/2017, 04/23/2018  . Td 06/20/1996, 02/09/2007  . Tdap 05/17/2017  4. Prostate cancer screening-  no family history, start at age 69 or 60 more likely 35. Colon cancer screening -  no family history, start at age 31 6. Skin cancer screening-follows with Dr. Wilhemina Bonito yearly due to history of basal cell skin cancer. New Mohs in last year - on right scalp. Basal cell also removed on left arm. advised regular sunscreen use. Denies  worrisome, changing, or new skin lesions.  7.  Never smoker 8.  GU issues- advised monthly self exams of testicles-no issues reported.  STD screening-patient opts out as monogamous and no concerns of infidelity   Status of chronic or acute concerns   Left groin hernia-has seen surgeon in the past.  Patient opts for monitoring only for now.  Not currently bothering him.  Sinus node dysfunction with ectopic atrial rhythm-patient has seen cardiology in the past due to variance of heart rate from 40s to high 90s-for the most part heart rate remains in high 90s.  Patient has had a reassuring stress test in the past.  Hyperlipidemia- 10-year risk has been under 2.5%-we will update lipids again today as well as 10-year risk calculation  Ganglion cyst noted left wrist- looks like may have one on right wrist as well.  Could see Dr. Paulla Fore if needed  No problem-specific Assessment & Plan notes found for this encounter.  No follow-ups on file.  Lab/Order associations: Fasting  Preventative health care - Plan: CBC, Lipid panel, Comprehensive metabolic panel  Hyperlipidemia, unspecified hyperlipidemia type - Plan: CBC, Lipid panel, Comprehensive metabolic panel  No orders of the defined types were placed in this encounter.   Return precautions advised.  Garret Reddish, MD

## 2018-05-22 ENCOUNTER — Ambulatory Visit: Payer: Self-pay | Admitting: *Deleted

## 2018-05-22 ENCOUNTER — Encounter: Payer: Self-pay | Admitting: Family Medicine

## 2018-05-22 NOTE — Telephone Encounter (Signed)
Message from Yvette Rack sent at 05/22/2018 2:48 PM EST   Pt calling stating that he had received his lab results through mychart and want to know what he can do or try to lower his Triglycerides he will be in a meeting so you can leave a message on mychart or voice message but would rather see it through [name]

## 2018-05-22 NOTE — Telephone Encounter (Signed)
See note

## 2018-05-22 NOTE — Telephone Encounter (Signed)
See my chart message

## 2018-09-09 ENCOUNTER — Encounter: Payer: Self-pay | Admitting: Family Medicine

## 2018-09-11 ENCOUNTER — Encounter: Payer: Self-pay | Admitting: Family Medicine

## 2018-09-11 ENCOUNTER — Ambulatory Visit (INDEPENDENT_AMBULATORY_CARE_PROVIDER_SITE_OTHER): Payer: BLUE CROSS/BLUE SHIELD | Admitting: Family Medicine

## 2018-09-11 VITALS — Temp 97.9°F | Ht 72.0 in | Wt 167.0 lb

## 2018-09-11 DIAGNOSIS — E785 Hyperlipidemia, unspecified: Secondary | ICD-10-CM

## 2018-09-11 DIAGNOSIS — R21 Rash and other nonspecific skin eruption: Secondary | ICD-10-CM

## 2018-09-11 NOTE — Patient Instructions (Addendum)
Video visit  Patient will let us know if he has new or worsening symptoms-we discussed this may take several weeks to resolve and may have some lingering scar tissue underneath.  Recommended warm compresses-reasonable to do these every 3-4 hours if able

## 2018-09-11 NOTE — Progress Notes (Signed)
Phone [phone]   Subjective:  Virtual visit via Video note  Our team/I connected with Casey Zavala on 09/11/18 at  2:20 PM EDT by phone (patient did not have equipment for webex) and verified that I am speaking with the correct person using two identifiers.  Location patient: at work (o2) Location provider: Garment/textile technologist, office Persons participating in the virtual visit: patient  Our team/I discussed the limitations of evaluation and management by telemedicine and the availability of in person appointments. In light of current covid-19 pandemic, patient also understands that we are trying to protect them by minimizing in office contact if at all possible.  The patient expressed consent for telemedicine visit and agreed to proceed.   ROS-  No chest pain or shortness of breath. No headache or blurry vision. No fever/cough  Past Medical History-  Patient Active Problem List   Diagnosis Date Noted  . History of skin cancer 10/31/2017    Priority: Low  . Sinus node dysfunction ([place]) 09/06/2014    Priority: Low  . Hyperlipidemia 09/06/2014    Priority: Low  . Left groin hernia 01/17/2014    Priority: Low    Medications- reviewed and updated Current Outpatient Medications  Medication Sig Dispense Refill  . busPIRone (BUSPAR) 5 MG tablet Take 1 tablet (5 mg total) by mouth 2 (two) times daily as needed (anxiety with travel). 10 tablet 0   No current facility-administered medications for this visit.      Objective:  Temp 97.9 F (36.6 C) (Temporal)   Ht 6' (1.829 m)   Wt 167 lb (75.8 kg)   BMI 22.65 kg/m  Gen: NAD, resting comfortably Lungs: nonlabored, normal respiratory rate  Skin: warm, dry, to the right of patient's right nipple there is an erythematous patch of skin- appears to have a central whitehead.  When patient touches area somewhat tender.  Near his waist-on both sides has several erythematous macules at the base of hair follicles.     Assessment and  Plan    Rash  Hyperlipidemia, unspecified hyperlipidemia type  S: worked in the yard all day Saturday. Woke up Sunday morning and even Saturday night noted some irritation. Felt slight lump near the nipple- now he has developed a white head in the area.  Area seems to be mild to moderately tender.  Seems to be improving somewhat.  Also notes some red spots around trunk bilaterally appeared in last 24-36 hours. Also tends to have sensitive skin in general and sometimes gets red spots on chest.   Wt Readings from Last 3 Encounters:  09/11/18 167 lb (75.8 kg)  05/21/18 179 lb 12.8 oz (81.6 kg)  03/20/18 182 lb (82.6 kg)  A/P: 2 separate rashes here-the one near his nipple appears to be a pimple or small abscess-will use warm compresses to try to bring this back down.  He asks about this being a spider bite-that is also possible but I think somewhat less likely.  Regarding a rash around his waist-possible mild folliculitis or heat rash-was it working out in the yard in the heat.  #hyperlipidemia S: Poorly controlled on last visit-we discussed lifestyle modifications to help bring levels down.  Patient is down 12 pounds since December!watching what he eats. Doesn't eat as much with the stress as well Lab Results  Component Value Date   CHOL 251 (H) 05/21/2018   HDL 65.00 05/21/2018   LDLCALC 157 (H) 05/21/2018   TRIG 144.0 05/21/2018   CHOLHDL 4 05/21/2018  A/P: Hopefully we will see some improvements with lifestyle changes and lipids at next visit-already planned for December   Future Appointments  Date Time Provider [place]  05/27/2019  8:00 AM Marin Olp, MD [facility]   Lab/Order associations: Rash  Hyperlipidemia, unspecified hyperlipidemia type  Return precautions advised.  Garret Reddish, MD

## 2018-10-23 DIAGNOSIS — L821 Other seborrheic keratosis: Secondary | ICD-10-CM | POA: Diagnosis not present

## 2018-10-23 DIAGNOSIS — D225 Melanocytic nevi of trunk: Secondary | ICD-10-CM | POA: Diagnosis not present

## 2018-10-23 DIAGNOSIS — Z85828 Personal history of other malignant neoplasm of skin: Secondary | ICD-10-CM | POA: Diagnosis not present

## 2018-10-23 DIAGNOSIS — L111 Transient acantholytic dermatosis [Grover]: Secondary | ICD-10-CM | POA: Diagnosis not present

## 2019-02-13 ENCOUNTER — Encounter: Payer: Self-pay | Admitting: Family Medicine

## 2019-02-18 ENCOUNTER — Telehealth: Payer: Self-pay | Admitting: Family Medicine

## 2019-02-18 NOTE — Telephone Encounter (Signed)
See note

## 2019-02-18 NOTE — Telephone Encounter (Signed)
Casey Zavala stated she received the patient's Biometric Screening form from the provider but it had a total Cholesterol of 4.  She is double checking the results because it does not sound right, and she wants to know if the patient was fasting also.  Please advise.

## 2019-02-19 NOTE — Telephone Encounter (Signed)
[facility] and advised of total chol level from 05/2018.

## 2019-02-21 ENCOUNTER — Other Ambulatory Visit: Payer: Self-pay

## 2019-02-21 ENCOUNTER — Ambulatory Visit (INDEPENDENT_AMBULATORY_CARE_PROVIDER_SITE_OTHER): Payer: BC Managed Care – PPO | Admitting: Family Medicine

## 2019-02-21 ENCOUNTER — Encounter: Payer: Self-pay | Admitting: Family Medicine

## 2019-02-21 ENCOUNTER — Ambulatory Visit: Payer: Self-pay | Admitting: *Deleted

## 2019-02-21 VITALS — BP 116/82 | HR 60 | Temp 98.7°F | Ht 72.0 in | Wt 175.4 lb

## 2019-02-21 DIAGNOSIS — I495 Sick sinus syndrome: Secondary | ICD-10-CM | POA: Diagnosis not present

## 2019-02-21 DIAGNOSIS — R002 Palpitations: Secondary | ICD-10-CM

## 2019-02-21 DIAGNOSIS — R001 Bradycardia, unspecified: Secondary | ICD-10-CM

## 2019-02-21 NOTE — Telephone Encounter (Signed)
Pt reports he woke at 2 am this AM, sweating and HR irregular. H/O SA node dysfunction. States duration of 2 hours. Reports "Did not have any pain , no tightness, no dizziness , no SOB but felt weird."  No symptoms presently, states HR regular. Reports HR varies, usually 90-100 but has been "In my normal 50-60 range last few weeks." States when occurred took 2 baby aspirin, did some deep breathing exercise and HR "back to normal." States has not seen cardiologist in several years.  Call transferred to practice, [facility], for consideration of appt today; pt scheduled for this afternoon.   Reason for Disposition . [1] Skipped or extra beat(s) AND [2] occurs 4 or more times per minute  Answer Assessment - Initial Assessment Questions 1. DESCRIPTION: "Please describe your heart rate or heart beat that you are having" (e.g., fast/slow, regular/irregular, skipped or extra beats, "palpitations")    "Irregular" 2. ONSET: "When did it start?" (Minutes, hours or days)      2am, lasted until 4am 3. DURATION: "How long does it last" (e.g., seconds, minutes, hours)     2 hours 4. PATTERN "Does it come and go, or has it been constant since it started?"  "Does it get worse with exertion?"   "Are you feeling it now?"     Not presently 5. TAP: "Using your hand, can you tap out what you are feeling on a chair or table in front of you, so that I can hear?" (Note: not all patients can do this)       6. HEART RATE: "Can you tell me your heart rate?" "How many beats in 15 seconds?"  (Note: not all patients can do this)        7. RECURRENT SYMPTOM: "Have you ever had this before?" If so, ask: "When was the last time?" and "What happened that time?"      Dx SA Node dysfunction "Years ago" 8. CAUSE: "What do you think is causing the palpitations?"    Unsure 9. CARDIAC HISTORY: "Do you have any history of heart disease?" (e.g., heart attack, angina, bypass surgery, angioplasty, arrhythmia)     10. OTHER SYMPTOMS: "Do  you have any other symptoms?" (e.g., dizziness, chest pain, sweating, difficulty breathing)       "No pain but felt weird"  Protocols used: HEART RATE AND HEARTBEAT QUESTIONS-A-AH

## 2019-02-21 NOTE — Telephone Encounter (Signed)
Noted  

## 2019-02-21 NOTE — Progress Notes (Signed)
Phone [phone]   Subjective:  Casey Zavala is a 48 y.o. year old very pleasant male patient who presents for/with See problem oriented charting Chief Complaint  Patient presents with  . Palpitations   ROS-no fever/chills/cough/congestion.  No chest pain or shortness of breath  Past Medical History-  Patient Active Problem List   Diagnosis Date Noted  . History of skin cancer 10/31/2017    Priority: Low  . Sinus node dysfunction ([place]) 09/06/2014    Priority: Low  . Hyperlipidemia 09/06/2014    Priority: Low  . Left groin hernia 01/17/2014    Priority: Low    Medications- reviewed and updated Current Outpatient Medications  Medication Sig Dispense Refill  . busPIRone (BUSPAR) 5 MG tablet Take 1 tablet (5 mg total) by mouth 2 (two) times daily as needed (anxiety with travel). 10 tablet 0   No current facility-administered medications for this visit.      Objective:  BP 116/82 (BP Location: Left Arm, Patient Position: Sitting, Cuff Size: Normal)   Pulse 60   Temp 98.7 F (37.1 C) (Temporal)   Ht 6' (1.829 m)   Wt 175 lb 6.4 oz (79.6 kg)   SpO2 98%   BMI 23.79 kg/m  Gen: NAD, resting comfortably No lymphadenopathy noted.  No tenderness along neck CV: RRR no murmurs rubs or gallops Lungs: CTAB no crackles, wheeze, rhonchi Abdomen: soft/nontender/nondistended/normal bowel sounds. Ext: no edema Skin: warm, dry  EKG: Sinus bradycardia with rate 50, normal axis, normal intervals, no hypertrophy, no st or t wave chages.  Only change from prior EKGs is bradycardia     Assessment and Plan   # Palpitations S:Last night 2 am,  Woke up last night with sweaty episode, sense of not quite feeling right/"off" and HR was in the 50s- seemed to be getting some pauses when checked carotid pulses. Took Aspirin 81 mg x 2. Some tenderness over neck where he checked pulse regularly- by 4 AM sensation of something by off resolved- checked HR and still in 50s but seemed regular  again.    Other symptoms in last few weeks-   Also reports pressure behind both eyes that comes and goes, seems to be worse in the mornings for a few weeks. Still feels perhaps mild tenderness in neck but no longer having a sensation of something feeling off in chest today  Denies CP, SOB, fever, nausea, palor, jaw pain, upper extremity pain, slurred speech, facial drooping.  Patient does have a very mild sensation like a sure it is tight on his left neck perhaps 0.5 out of 10 discomfort.  Hx of heart rate fluctuations due to sinus node dysfunction-has seen Dr. Williemae Natter recently 2016. Typically 9-10 months of year HR in 90s, occasionally will drop down into 50s. If feeling lightheaded, will note HR much lower and notes HR in 50s and 60s- mainly issues with standing.   Father had atrial fibrillation and eventually had a stroke- sounds like patient's father required cardioversion at times.  A/P: 48 year old male with sinus node dysfunction and currently untreated hyperlipidemia due to low ASCVD risk- with sweatiness, general feeling of unwellness in his chest, palpitations and palpated feeling of irregular heart rate over 1 to 2-hour.  With father with family history of atrial fibrillation.  Patient has seen Dr. Debara Pickett in the past- I recommended we get him back in for follow-up for consideration of cardiac monitoring.  Patient does have some mild left neck pain 0.5 out of 10-I do not believe  this represents ischemic disease but I did offer updating labs including a troponin which patient declined for now.  Recommended urgent follow-up if has recurrent symptoms-preferably have him be driven to here, urgent care, ED so we can get EKG at time of symptoms.   Recommended follow up: As needed for current issue-has physical in December Future Appointments  Date Time Provider [place]  05/27/2019  8:00 AM Marin Olp, MD [facility]   Lab/Order associations:   ICD-10-CM   1. Palpitations   R00.2 EKG 12-Lead    Ambulatory referral to Cardiology  2. Bradycardia  R00.1 Ambulatory referral to Cardiology  3. Sinus node dysfunction (HCC)  I49.5 Ambulatory referral to Cardiology   Return precautions advised.  Garret Reddish, MD

## 2019-02-21 NOTE — Patient Instructions (Addendum)
Health Maintenance Due  Topic Date Due  . INFLUENZA VACCINE - will complete later in flu season (please let us know if you get this at another location so we can update your chart)  01/19/2019   We will call you within two weeks about your referral to cardiology Dr. Debara Pickett. If you do not hear within 3 weeks, give [place] a call.   If recurrent episodes or new symptoms- please alert [place] immediately. Ideally if you are having palpitations- getting someone to drive you to get EKG whether here, urgent care, ED would be ideal.

## 2019-02-22 ENCOUNTER — Telehealth: Payer: Self-pay | Admitting: Internal Medicine

## 2019-02-22 NOTE — Telephone Encounter (Signed)
LVM for patient to call and schedule appointment with Dr. Debara Pickett.  Pt has been seen previously by Dr. Debara Pickett in 2016.

## 2019-04-02 DIAGNOSIS — H5213 Myopia, bilateral: Secondary | ICD-10-CM | POA: Diagnosis not present

## 2019-04-10 ENCOUNTER — Encounter: Payer: Self-pay | Admitting: Internal Medicine

## 2019-04-10 ENCOUNTER — Ambulatory Visit: Payer: BC Managed Care – PPO | Admitting: Internal Medicine

## 2019-04-10 ENCOUNTER — Other Ambulatory Visit: Payer: Self-pay

## 2019-04-10 VITALS — BP 144/93 | HR 106 | Ht 72.0 in | Wt 175.0 lb

## 2019-04-10 DIAGNOSIS — R002 Palpitations: Secondary | ICD-10-CM

## 2019-04-10 MED ORDER — METOPROLOL TARTRATE 25 MG PO TABS: 12.5000 mg | ORAL_TABLET | Freq: Every day | ORAL | 5 refills | Status: DC | PRN

## 2019-04-10 NOTE — Patient Instructions (Addendum)
Medication Instructions:  Dr Debara Pickett has recommended making the following medication changes: 1. TAKE Metoprolol tartrate 0.5 to 1 tablet (12.5 to 25 mg total) as needed for heart palpitations  *If you need a refill on your cardiac medications before your next appointment, please call your pharmacy*  Follow-Up: At [facility], you and your health needs are our priority.  As part of our continuing mission to provide you with exceptional heart care, we have created designated Provider Care Teams.  These Care Teams include your primary Cardiologist (physician) and Advanced Practice Providers (APPs -  Physician Assistants and Nurse Practitioners) who all work together to provide you with the care you need, when you need it.  Your next appointment:   12 months  The format for your next appointment:   In Person  Provider:   You may see No primary care provider on file. or one of the following Advanced Practice Providers on your designated Care Team:    Almyra Deforest, PA-C  Fabian Sharp, [place] or   Roby Lofts, [place]   Dr Debara Pickett recommends using an Apple Watch or Jodelle Red mobile ([email or web address]) to monitor heart palpitations.

## 2019-04-10 NOTE — Progress Notes (Signed)
OFFICE NOTE  Chief Complaint:  Palpitations  Primary Care Physician: Marin Olp, MD  HPI:  Casey Zavala is a pleasant 48 year old male who currently presents for evaluation of tachycardia with alternating tachycardia. Casey Zavala is asymptomatic in remains active with regular exercise. He denies any chest pain or shortness of breath. He has no exercise intolerance. He's recently noted elevated heart rates for him which are in the 80s and 90s. Alternatively he has noted some heart rates in the 50s. EKG in the office today shows an ectopic atrial rhythm with short PR interval and mild junctional depression, heart rate 88. QTC is 450 ms. PR interval is 106 ms. He reports he is able to get his heart rate elevated with exercise. There is a family history of arrhythmia with his grandfather who had a pacemaker and father who apparently had "heart shocks" for a presumed arrhythmia.  04/10/2019  [name] is seen today for follow-up.  He was last seen in 2016 and therefore is considered a new patient.  The time he was having some unexplained tachycardia.  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  Recently he has reported some more shortness of breath and fatigue with exertion.  He is not currently taking any medications.  EKG shows a sinus rhythm at 97, however it was noted he had a history of tachycardia in the past.  He does also have significant anxiety.  He did have exercise treadmill stress testing in 2016 which was negative for ischemia and demonstrated a good exercise workload.  He also reports some episodes of palpitations which occur quite infrequently, in fact his last episode was more than a month ago.  PMHx:  Past Medical History:  Diagnosis Date  . Basal cell carcinoma of skin   . Left groin hernia    actually improved with exercise    Past Surgical History:  Procedure Laterality Date  . BASAL CELL CARCINOMA EXCISION      FAMHx:   Family History  Problem Relation Age of Onset  . Stroke Father   . Hypothyroidism Mother        also dementia  . Diabetes Mother   . Depression Sister   . Diabetes Brother   . Schizophrenia Brother   . Kidney disease Brother   . Diabetes Maternal Grandmother   . Dementia Maternal Grandfather   . Heart attack Paternal Grandmother   . Lung cancer Paternal Grandfather     SOCHx:   reports that he has never smoked. He has never used smokeless tobacco. He reports current alcohol use of about 2.0 standard drinks of alcohol per week. He reports that he does not use drugs.  ALLERGIES:  No Known Allergies  ROS: A comprehensive review of systems was negative.  HOME MEDS: Current Outpatient Medications  Medication Sig Dispense Refill  . busPIRone (BUSPAR) 5 MG tablet Take 1 tablet (5 mg total) by mouth 2 (two) times daily as needed (anxiety with travel). 10 tablet 0   No current facility-administered medications for this visit.     LABS/IMAGING: No results found for this or any previous visit (from the past 48 hour(s)). No results found.  WEIGHTS: Wt Readings from Last 3 Encounters:  04/10/19 175 lb (79.4 kg)  02/21/19 175 lb 6.4 oz (79.6 kg)  09/11/18 167 lb (75.8 kg)    VITALS: BP (!) 144/93   Pulse (!) 106   Ht 6' (1.829 m)  Wt 175 lb (79.4 kg)   SpO2 97%   BMI 23.73 kg/m   EXAM: General appearance: alert and no distress Neck: no carotid bruit and no JVD Lungs: clear to auscultation bilaterally Heart: regular rate and rhythm, S1, S2 normal, no murmur, click, rub or gallop Abdomen: soft, non-tender; bowel sounds normal; no masses,  no organomegaly Extremities: extremities normal, atraumatic, no cyanosis or edema Pulses: 2+ and symmetric Skin: Skin color, texture, turgor normal. No rashes or lesions Neurologic: Grossly normal Psych: Pleasant  EKG: Normal sinus rhythm at 97-personally reviewed  ASSESSMENT: 1. Possible sinus node dysfunction with ectopic  atrial rhythm 2. Borderline short PR interval, asymptomatic 3. Dyspnea/fatigue-negative exercise treadmill stress test in 2016 4. Palpitations  PLAN: 1.   Mr. Schrupp has reported some dyspnea and fatigue however does not exercise and is under a lot of stress.  He had negative treadmill stress testing in 2016.  His EKG is normal today.  He is considered low risk for coronary disease.  He also reported some palpitations.  This occurred more than a month ago and have been sporadic.  It is difficult to determine whether we will successfully be able to monitor for them therefore I recommend either an apple watch or cardia mobile device to try and detect them.  We will also provide a low-dose of metoprolol tartrate for him to have as needed if he has recurrent palpitations.  We will plan annual follow-up per his request or sooner as necessary.  Pixie Casino, MD, [facility], [place] Director of the Advanced Lipid Disorders &  Cardiovascular Risk Reduction Clinic Diplomate of the American Board of Clinical Lipidology Attending Cardiologist  Direct Dial: [phone]  Fax: [phone]  Website:  [email or web address]  04/10/2019, 9:58 AM

## 2019-04-13 ENCOUNTER — Encounter: Payer: Self-pay | Admitting: Internal Medicine

## 2019-04-19 DIAGNOSIS — Z713 Dietary counseling and surveillance: Secondary | ICD-10-CM | POA: Diagnosis not present

## 2019-04-29 DIAGNOSIS — L821 Other seborrheic keratosis: Secondary | ICD-10-CM | POA: Diagnosis not present

## 2019-04-29 DIAGNOSIS — Z85828 Personal history of other malignant neoplasm of skin: Secondary | ICD-10-CM | POA: Diagnosis not present

## 2019-04-29 DIAGNOSIS — L573 Poikiloderma of Civatte: Secondary | ICD-10-CM | POA: Diagnosis not present

## 2019-04-29 DIAGNOSIS — D225 Melanocytic nevi of trunk: Secondary | ICD-10-CM | POA: Diagnosis not present

## 2019-04-29 DIAGNOSIS — C4441 Basal cell carcinoma of skin of scalp and neck: Secondary | ICD-10-CM | POA: Diagnosis not present

## 2019-04-29 DIAGNOSIS — L570 Actinic keratosis: Secondary | ICD-10-CM | POA: Diagnosis not present

## 2019-05-24 ENCOUNTER — Other Ambulatory Visit: Payer: Self-pay

## 2019-05-24 NOTE — Progress Notes (Deleted)
Phone: [phone]   Subjective:  Patient presents today for their annual physical. Chief complaint-noted.   See problem oriented charting- ROS- full  review of systems was completed and negative except for: ***  The following were reviewed and entered/updated in epic: Past Medical History:  Diagnosis Date  . Basal cell carcinoma of skin   . Left groin hernia    actually improved with exercise   Patient Active Problem List   Diagnosis Date Noted  . History of skin cancer 10/31/2017  . Sinus node dysfunction ([place]) 09/06/2014  . Hyperlipidemia 09/06/2014  . Left groin hernia 01/17/2014   Past Surgical History:  Procedure Laterality Date  . BASAL CELL CARCINOMA EXCISION      Family History  Problem Relation Age of Onset  . Stroke Father   . Hypothyroidism Mother        also dementia  . Diabetes Mother   . Depression Sister   . Diabetes Brother   . Schizophrenia Brother   . Kidney disease Brother   . Diabetes Maternal Grandmother   . Dementia Maternal Grandfather   . Heart attack Paternal Grandmother   . Lung cancer Paternal Grandfather     Medications- reviewed and updated Current Outpatient Medications  Medication Sig Dispense Refill  . busPIRone (BUSPAR) 5 MG tablet Take 1 tablet (5 mg total) by mouth 2 (two) times daily as needed (anxiety with travel). 10 tablet 0  . metoprolol tartrate (LOPRESSOR) 25 MG tablet Take 0.5-1 tablets (12.5-25 mg total) by mouth daily as needed (heart palpitations). 30 tablet 5   No current facility-administered medications for this visit.     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    [employer] background      Hobbies: exercising   Objective  Objective:  There were no vitals taken for this visit. Gen: NAD, resting comfortably HEENT: Mucous membranes are  moist. Oropharynx normal Neck: no thyromegaly CV: RRR no murmurs rubs or gallops Lungs: CTAB no crackles, wheeze, rhonchi Abdomen: soft/nontender/nondistended/normal bowel sounds. No rebound or guarding.  Ext: no edema Skin: warm, dry Neuro: grossly normal, moves all extremities, PERRLA***   Assessment and Plan   48 y.o. male presenting for annual physical.  Health Maintenance counseling: 1. Anticipatory guidance: Patient counseled regarding regular dental exams ***q6 months, eye exams ***,  avoiding smoking and second hand smoke*** , limiting alcohol to 1 beverage per day*** .   2. Risk factor reduction:  Advised patient of need for regular exercise and diet rich and fruits and vegetables to reduce risk of heart attack and stroke. Exercise- ***. Diet-***.  Wt Readings from Last 3 Encounters:  04/10/19 175 lb (79.4 kg)  02/21/19 175 lb 6.4 oz (79.6 kg)  09/11/18 167 lb (75.8 kg)   3. Immunizations/screenings/ancillary studies Immunization History  Administered Date(s) Administered  . Influenza Split 05/31/2011  . Influenza Whole 04/02/2008, 04/22/2009  . Influenza-Unspecified 04/20/2017, 04/23/2018  . Td 06/20/1996, 02/09/2007  . Tdap 05/17/2017   Health Maintenance Due  Topic Date Due  . INFLUENZA VACCINE  01/19/2019   4. Cervical cancer screening- *** 5. Breast cancer screening-  breast exam *** and mammogram *** 6. Colon cancer screening - *** 7. Skin cancer screening- ***advised regular sunscreen use. Denies worrisome, changing, or new skin lesions.  8. Birth control/STD check- ***  9. Osteoporosis screening at 60- *** -*** smoker  Status of chronic or acute concerns   No specialty comments available. # Hyperlipidemia, unspecified hyperlipidemia type S: not currently on medication. Patient was going to work on diet and recheck at this visit.  A/P: ***       Recommended follow up: ***No follow-ups on file. Future Appointments  Date Time Provider [place]  05/27/2019  8:00 AM Marin Olp, MD [facility]    No chief complaint on file.  Lab/Order associations:*** fasting   ICD-10-CM   1. Hyperlipidemia, unspecified hyperlipidemia type  E78.5   2. Heart palpitations  R00.2     No orders of the defined types were placed in this encounter.   Return precautions advised.  Francella Solian, CMA

## 2019-05-24 NOTE — Patient Instructions (Addendum)
Health Maintenance Due  Topic Date Due  . INFLUENZA VACCINE  Will get today  01/19/2019   Please stop by lab before you go If you do not have mychart- we will call you about results within 5 business days of [place] receiving them.  If you have mychart- we will send your results within 3 business days of [place] receiving them.  If abnormal or we want to clarify a result, we will call or mychart you to make sure you receive the message.  If you have questions or concerns or don't hear within 5-7 days, please send [place] a message or call us.   Recommended follow [id number] in about 1 year (around 05/26/2020) for CPE.   Marland Kitchen.It takes about 2 weeks for protection to develop after vaccination.  There are many flu viruses, and they are always changing. Each year a new flu vaccine is made to protect against three or four viruses that are likely to cause disease in the upcoming flu season. Even when the vaccine doesn't exactly match these viruses, it may still provide some protection.   Influenza vaccine does not cause flu.  Influenza vaccine may be given at the same time as other vaccines.  3. Talk with your health care provider  Tell your vaccine provider if the person getting the vaccine: ; Has had an allergic reaction after a previous dose of influenza vaccine, or has any severe, life-threatening allergies.  ; Has ever had Guillain-Barr Syndrome (also called GBS).  In some cases, your health care provider may decide to postpone influenza vaccination to a future visit.  People with minor illnesses, such as a cold, may be vaccinated. People who are moderately or severely ill should usually wait until they recover before getting influenza vaccine.  Your health care provider can give you more information.  4. Risks of a reaction  ; Soreness, redness, and swelling where shot is given, fever, muscle aches, and headache can happen after influenza vaccine. ; There may be a very small increased risk of  Guillain-Barr Syndrome (GBS) after inactivated influenza vaccine (the flu shot).  Young children who get the flu shot along with pneumococcal vaccine (PCV13), and/or DTaP vaccine at the same time might be slightly more likely to have a seizure caused by fever. Tell your health care provider if a child who is getting flu vaccine has ever had a seizure.  People sometimes faint after medical procedures, including vaccination. Tell your provider if you feel dizzy or have vision changes or ringing in the ears.  As with any medicine, there is a very remote chance of a vaccine causing a severe allergic reaction, other serious injury, or death.  5. What if there is a serious problem?  An allergic reaction could occur after the vaccinated person leaves the clinic. If you see signs of a severe allergic reaction (hives, swelling of the face and throat, difficulty breathing, a fast heartbeat, dizziness, or weakness), call 9-1-1 and get the person to the nearest hospital.  For other signs that concern you, call your health care provider.  Adverse reactions should be reported to the Vaccine Adverse Event Reporting System (VAERS). Your health care provider will usually file this report, or you can do it yourself. Visit the VAERS website at www.vaers.SamedayNews.es or call 601 421 6838.  VAERS is only for reporting reactions, and VAERS staff do not give medical advice.  6. The National Vaccine Injury Compensation Program  The [employer] Vaccine Injury Compensation Program (VICP) is a Technical brewer that  was created to compensate people who may have been injured by certain vaccines. Visit the VICP website at GoldCloset.com.ee or call 819-437-9355 to learn about the program and about filing a claim. There is a time limit to file a claim for compensation.  7. How can I learn more?  ; Ask your health care provider.  ; Call your local or state health department. ; Contact the Centers for Disease  Control and Prevention (CDC): - Call 901-823-9793 (1-800-CDC-INFO) or - Visit CDC's influenza website at https://gibson.com/  Vaccine Information Statement (Interim) Inactivated Influenza Vaccine  02/01/2018 42 U.S.C.  [phone]   Department of Health and Geneticist, molecular for Disease Control and Prevention  Office Use Only

## 2019-05-27 ENCOUNTER — Encounter: Payer: Self-pay | Admitting: Family Medicine

## 2019-05-27 ENCOUNTER — Other Ambulatory Visit: Payer: Self-pay

## 2019-05-27 ENCOUNTER — Ambulatory Visit (INDEPENDENT_AMBULATORY_CARE_PROVIDER_SITE_OTHER): Payer: BC Managed Care – PPO | Admitting: Family Medicine

## 2019-05-27 VITALS — BP 136/88 | HR 83 | Temp 98.2°F | Ht 72.0 in | Wt 177.2 lb

## 2019-05-27 DIAGNOSIS — Z1211 Encounter for screening for malignant neoplasm of colon: Secondary | ICD-10-CM

## 2019-05-27 DIAGNOSIS — E785 Hyperlipidemia, unspecified: Secondary | ICD-10-CM | POA: Diagnosis not present

## 2019-05-27 DIAGNOSIS — Z0000 Encounter for general adult medical examination without abnormal findings: Secondary | ICD-10-CM

## 2019-05-27 DIAGNOSIS — Z85828 Personal history of other malignant neoplasm of skin: Secondary | ICD-10-CM

## 2019-05-27 DIAGNOSIS — I495 Sick sinus syndrome: Secondary | ICD-10-CM

## 2019-05-27 DIAGNOSIS — R739 Hyperglycemia, unspecified: Secondary | ICD-10-CM | POA: Diagnosis not present

## 2019-05-27 DIAGNOSIS — R002 Palpitations: Secondary | ICD-10-CM

## 2019-05-27 DIAGNOSIS — Z23 Encounter for immunization: Secondary | ICD-10-CM

## 2019-05-27 DIAGNOSIS — K4090 Unilateral inguinal hernia, without obstruction or gangrene, not specified as recurrent: Secondary | ICD-10-CM

## 2019-05-27 LAB — COMPREHENSIVE METABOLIC PANEL
ALT: 16 U/L (ref 0–53)
AST: 17 U/L (ref 0–37)
Albumin: 4.8 g/dL (ref 3.5–5.2)
Alkaline Phosphatase: 48 U/L (ref 39–117)
BUN: 13 mg/dL (ref 6–23)
CO2: 31 mEq/L (ref 19–32)
Calcium: 9.8 mg/dL (ref 8.4–10.5)
Chloride: 102 mEq/L (ref 96–112)
Creatinine, Ser: 0.92 mg/dL (ref 0.40–1.50)
GFR: 87.57 mL/min (ref >60.00)
Glucose, Bld: 99 mg/dL (ref 70–99)
Potassium: 4.5 mEq/L (ref 3.5–5.1)
Sodium: 139 mEq/L (ref 135–145)
Total Bilirubin: 0.4 mg/dL (ref 0.2–1.2)
Total Protein: 7.7 g/dL (ref 6.0–8.3)

## 2019-05-27 LAB — CBC WITH DIFFERENTIAL/PLATELET
Basophils Absolute: 0 10*3/uL (ref 0.0–0.1)
Basophils Relative: 0.4 % (ref 0.0–3.0)
Eosinophils Absolute: 0.1 10*3/uL (ref 0.0–0.7)
Eosinophils Relative: 2.7 % (ref 0.0–5.0)
HCT: 45.5 % (ref 39.0–52.0)
Hemoglobin: 15.2 g/dL (ref 13.0–17.0)
Lymphocytes Relative: 31.2 % (ref 12.0–46.0)
Lymphs Abs: 1.6 10*3/uL (ref 0.7–4.0)
MCHC: 33.5 g/dL (ref 30.0–36.0)
MCV: 87.2 fl (ref 78.0–100.0)
Monocytes Absolute: 0.4 10*3/uL (ref 0.1–1.0)
Monocytes Relative: 8.2 % (ref 3.0–12.0)
Neutro Abs: 2.9 10*3/uL (ref 1.4–7.7)
Neutrophils Relative %: 57.5 % (ref 43.0–77.0)
Platelets: 303 10*3/uL (ref 150.0–400.0)
RBC: 5.21 Mil/uL (ref 4.22–5.81)
RDW: 13.3 % (ref 11.5–15.5)
WBC: 5 10*3/uL (ref 4.0–10.5)

## 2019-05-27 LAB — LIPID PANEL
Cholesterol: 237 mg/dL — ABNORMAL HIGH (ref 0–200)
HDL: 64.3 mg/dL (ref >39.00)
LDL Cholesterol: 154 mg/dL — ABNORMAL HIGH (ref 0–99)
NonHDL: 172.33
Total CHOL/HDL Ratio: 4
Triglycerides: 92 mg/dL (ref 0.0–149.0)
VLDL: 18.4 mg/dL (ref 0.0–40.0)

## 2019-05-27 LAB — POCT GLYCOSYLATED HEMOGLOBIN (HGB A1C): Hemoglobin A1C: 5.4 % (ref 4.0–5.6)

## 2019-05-27 NOTE — Assessment & Plan Note (Signed)
Sinus node dysfunction (HCC)/Heart palpitations-saw Dr. Debara Pickett back in October due to worsening palpitations.  He was also having some dyspnea and fatigue but was not getting regular exercise and was under a lot of stress.  Negative treadmill stress test in 2016.  EKG was normal at that visit.  They opted for as needed metoprolol.  Patient reports no palpitations since last visit- he now has apple watch to be able to monitor rhythm if recurs

## 2019-05-27 NOTE — Progress Notes (Signed)
Phone: [phone]    Subjective:  Patient presents today for their annual physical. Chief complaint-noted.   See problem oriented charting- Review of Systems  Constitutional: Negative.   HENT: Negative.   Eyes: Negative.   Respiratory: Negative.   Cardiovascular: Negative.   Gastrointestinal: Negative.   Genitourinary: Negative.   Musculoskeletal: Negative.   Skin: Negative.   Neurological: Negative.   Endo/Heme/Allergies: Negative.   Psychiatric/Behavioral: Negative.   - including no chest pain or palpitations   The following were reviewed and entered/updated in epic: Past Medical History:  Diagnosis Date  . Basal cell carcinoma of skin   . Left groin hernia    actually improved with exercise   Patient Active Problem List   Diagnosis Date Noted  . History of skin cancer 10/31/2017    Priority: Low  . Sinus node dysfunction ([place]) 09/06/2014    Priority: Low  . Hyperlipidemia 09/06/2014    Priority: Low  . Left groin hernia 01/17/2014    Priority: Low   Past Surgical History:  Procedure Laterality Date  . BASAL CELL CARCINOMA EXCISION      Family History  Problem Relation Age of Onset  . Stroke Father   . Hypothyroidism Mother        also dementia  . Diabetes Mother   . Depression Sister   . Diabetes Brother   . Schizophrenia Brother   . Kidney disease Brother   . Diabetes Maternal Grandmother   . Dementia Maternal Grandfather   . Heart attack Paternal Grandmother   . Lung cancer Paternal Grandfather     Medications- reviewed and updated Current Outpatient Medications  Medication Sig Dispense Refill  . busPIRone (BUSPAR) 5 MG tablet Take 1 tablet (5 mg total) by mouth 2 (two) times daily as needed (anxiety with travel). 10 tablet 0  . metoprolol tartrate (LOPRESSOR) 25 MG tablet Take 0.5-1 tablets (12.5-25 mg total) by mouth daily as needed (heart palpitations). 30 tablet 5   No current facility-administered medications for this visit.      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    Quaker background      Hobbies: exercising      Objective:  BP 136/88   Pulse 83   Temp 98.2 F (36.8 C) (Temporal)   Ht 6' (1.829 m)   Wt 177 lb 3.2 oz (80.4 kg)   SpO2 100%   BMI 24.03 kg/m  Gen: NAD, resting comfortably HEENT: Mask not removed due to covid 19. TM normal. Bridge of nose normal. Eyelids normal.  Neck: no thyromegaly or cervical lymphadenopathy  CV: RRR no murmurs rubs or gallops Lungs: CTAB no crackles, wheeze, rhonchi Abdomen: soft/nontender/nondistended/normal bowel sounds. No rebound or guarding.  Ext: no edema Skin: warm, dry Neuro: grossly normal, moves all extremities, PERRLA     Assessment and Plan:  48 y.o. male presenting for annual physical.  Health Maintenance counseling: 1. Anticipatory guidance: Patient counseled regarding regular dental exams q6 months, eye exams- yearly ,  avoiding smoking and second hand smoke , limiting alcohol to 2 beverages per day. On average 1-2 every other day    2. Risk factor reduction:  Advised patient of need for regular exercise and diet rich and fruits and vegetables to reduce risk of heart attack and stroke. Exercise- has tried to  start exercising but HR was high and he pulled back- he is going to try to ease back in.  Diet-working with dietitian through work. Has ut out diet sodas- doing more fruit and trying to increase veggies and fish Wt Readings from Last 3 Encounters:  05/27/19 177 lb 3.2 oz (80.4 kg)  04/10/19 175 lb (79.4 kg)  02/21/19 175 lb 6.4 oz (79.6 kg)  3. Immunizations/screenings/ancillary studies- flu shot today  Immunization History  Administered Date(s) Administered  . Influenza Split 05/31/2011  . Influenza Whole 04/02/2008, 04/22/2009  . Influenza-Unspecified 04/20/2017,  04/23/2018  . Td 06/20/1996, 02/09/2007  . Tdap 05/17/2017  4. Prostate cancer screening- around 15 yrs ago - apparently may have had an infetion at that time and then stopped checking after a while  5. Colon cancer screening - will refer today as we discussed newer guidelines.  6. Skin cancer screening/prevention- seen by Dermatology every 6 months due to history of skin cancer advised regular sunscreen use. Denies worrisome, changing, or new skin lesions.  7. Testicular cancer screening- advised monthly self exams. Does every few months  8. STD screening- patient opts out as monogomous 9. never smoker-   Status of chronic or acute concerns   Hyperlipidemia-update lipid panel today.  Likely would not start a statin with 10-year ASCVD risk only being 3.3% based on last years labs-unless there was a significant increase in numbers and risk was above 7.5% would remain off meds and continue lifestyle changes= has made good dietary changes but feels needs to increase exercise.    Sinus node dysfunction (HCC)/Heart palpitations-saw Dr. Debara Pickett back in October due to worsening palpitations.  He was also having some dyspnea and fatigue but was not getting regular exercise and was under a lot of stress.  Negative treadmill stress test in 2016.  EKG was normal at that visit.  They opted for as needed metoprolol.  Patient reports no palpitations since last visit- he now has apple watch to be able to monitor rhythm if recurs   Left groin hernia-patient denies recurrent bulge-has been evaluated by surgery in the past but was improving. No issues recently.    Hyperglycemia - Plan: POCT glycosylated hemoglobin (Hb A1C)-A1c only 5.4 today on her point-of-care machine.  We discussed this machine sometimes runs lower than phlebotomy-he may be in the lower ends of at risk for diabetes-fasting blood sugars are in this range and will repeat today  Recommended follow up: Return in about 1 year (around 05/26/2020) for  CPE.  Lab/Order associations: fasting   ICD-10-CM   1. Hyperlipidemia, unspecified hyperlipidemia type  E78.5 Lipid panel    CBC with Differential    Comprehensive metabolic panel  2. Heart palpitations  R00.2   3. Preventative health care  Z00.00 Lipid panel    POCT glycosylated hemoglobin (Hb A1C)    CBC with Differential    Comprehensive metabolic panel  4. Sinus node dysfunction (HCC)  I49.5   5. Left groin hernia  K40.90   6. History of skin cancer  Z85.828   7. Hyperglycemia  R73.9 POCT glycosylated hemoglobin (Hb A1C)  8. Screen for colon cancer  Z12.11 GI/gastroenterology   Return precautions advised.  Garret Reddish, MD

## 2019-07-03 DIAGNOSIS — H5213 Myopia, bilateral: Secondary | ICD-10-CM | POA: Diagnosis not present

## 2019-07-29 ENCOUNTER — Encounter: Payer: Self-pay | Admitting: Family Medicine

## 2019-08-30 DIAGNOSIS — Z713 Dietary counseling and surveillance: Secondary | ICD-10-CM | POA: Diagnosis not present

## 2019-10-30 DIAGNOSIS — Z713 Dietary counseling and surveillance: Secondary | ICD-10-CM | POA: Diagnosis not present

## 2019-11-07 DIAGNOSIS — L821 Other seborrheic keratosis: Secondary | ICD-10-CM | POA: Diagnosis not present

## 2019-11-07 DIAGNOSIS — D225 Melanocytic nevi of trunk: Secondary | ICD-10-CM | POA: Diagnosis not present

## 2019-11-07 DIAGNOSIS — Z85828 Personal history of other malignant neoplasm of skin: Secondary | ICD-10-CM | POA: Diagnosis not present

## 2019-11-07 DIAGNOSIS — D485 Neoplasm of uncertain behavior of skin: Secondary | ICD-10-CM | POA: Diagnosis not present

## 2019-11-29 DIAGNOSIS — M79672 Pain in left foot: Secondary | ICD-10-CM | POA: Diagnosis not present

## 2019-11-29 DIAGNOSIS — S92515A Nondisplaced fracture of proximal phalanx of left lesser toe(s), initial encounter for closed fracture: Secondary | ICD-10-CM | POA: Diagnosis not present

## 2019-12-13 ENCOUNTER — Other Ambulatory Visit: Payer: Self-pay

## 2019-12-13 ENCOUNTER — Encounter: Payer: Self-pay | Admitting: Family Medicine

## 2019-12-13 ENCOUNTER — Ambulatory Visit: Payer: BC Managed Care – PPO | Admitting: Family Medicine

## 2019-12-13 VITALS — BP 112/80 | HR 103 | Temp 98.1°F | Ht 72.0 in | Wt 178.2 lb

## 2019-12-13 DIAGNOSIS — R002 Palpitations: Secondary | ICD-10-CM

## 2019-12-13 DIAGNOSIS — R030 Elevated blood-pressure reading, without diagnosis of hypertension: Secondary | ICD-10-CM | POA: Diagnosis not present

## 2019-12-13 LAB — CBC WITH DIFFERENTIAL/PLATELET
Basophils Absolute: 0 10*3/uL (ref 0.0–0.1)
Basophils Relative: 0.6 % (ref 0.0–3.0)
Eosinophils Absolute: 0.1 10*3/uL (ref 0.0–0.7)
Eosinophils Relative: 1.8 % (ref 0.0–5.0)
HCT: 43.5 % (ref 39.0–52.0)
Hemoglobin: 14.7 g/dL (ref 13.0–17.0)
Lymphocytes Relative: 32.1 % (ref 12.0–46.0)
Lymphs Abs: 1.9 10*3/uL (ref 0.7–4.0)
MCHC: 33.7 g/dL (ref 30.0–36.0)
MCV: 86.2 fl (ref 78.0–100.0)
Monocytes Absolute: 0.4 10*3/uL (ref 0.1–1.0)
Monocytes Relative: 7.2 % (ref 3.0–12.0)
Neutro Abs: 3.5 10*3/uL (ref 1.4–7.7)
Neutrophils Relative %: 58.3 % (ref 43.0–77.0)
Platelets: 312 10*3/uL (ref 150.0–400.0)
RBC: 5.05 Mil/uL (ref 4.22–5.81)
RDW: 13.1 % (ref 11.5–15.5)
WBC: 6 10*3/uL (ref 4.0–10.5)

## 2019-12-13 LAB — COMPREHENSIVE METABOLIC PANEL
ALT: 13 U/L (ref 0–53)
AST: 15 U/L (ref 0–37)
Albumin: 4.9 g/dL (ref 3.5–5.2)
Alkaline Phosphatase: 44 U/L (ref 39–117)
BUN: 13 mg/dL (ref 6–23)
CO2: 28 mEq/L (ref 19–32)
Calcium: 9.9 mg/dL (ref 8.4–10.5)
Chloride: 100 mEq/L (ref 96–112)
Creatinine, Ser: 0.91 mg/dL (ref 0.40–1.50)
GFR: 88.48 mL/min (ref >60.00)
Glucose, Bld: 83 mg/dL (ref 70–99)
Potassium: 4.6 mEq/L (ref 3.5–5.1)
Sodium: 140 mEq/L (ref 135–145)
Total Bilirubin: 0.5 mg/dL (ref 0.2–1.2)
Total Protein: 7.5 g/dL (ref 6.0–8.3)

## 2019-12-13 LAB — TSH: TSH: 1 u[IU]/mL (ref 0.35–4.50)

## 2019-12-13 NOTE — Patient Instructions (Signed)
1) blood pressure is excellent here. Continue to monitor this. No red flags today, but continue to monitor this.   2) checking labs to rule out thyroid issues that can increase heart rate/palpitations and feeling of something in your throat.   3) bad things march with time so if symptoms continue we need to do more of a work up so please let dr. Yong Channel know.   So nice to meet you!  Dr. Rogers Blocker

## 2019-12-13 NOTE — Progress Notes (Signed)
Patient: Casey Zavala MRN: [id number] DOB: 1970/10/29 PCP: Marin Olp, MD     Subjective:  Chief Complaint  Patient presents with  . Hypertension  . Tingling    HPI: The patient is a 49 y.o. male who presents today for Hypertension with tingling in the head. He reports feeling out of it last night, his pulse was irregular for about 3 hours. He denies chest pain, blurred vision, dizziness, but feels like there is something in his throat. He has history of sinus node dysfunction and has seen cardiology. episode last night was same as it was 6-9 months ago when he had the irregular heart beat. He has metoprolol to take prn, but ended up not taking it last night.  He woke up this morning feeling fine. He went to a work meeting and just felt off. He felt like something was on his head with some tingling and had some shortness of breath. Heart rate was at his baseline. He went to have his blood pressure checked and it was 150/100. He feels fine now, but still feels off in a sense. Denies any chest pain, shortness of breath, palpitations. Just feels like he has a lump in his throat. No issues swallowing and no voice changes. No hx of thyroid disease.     Review of Systems  Constitutional: Negative for diaphoresis, fatigue and fever.  HENT: Negative for trouble swallowing.   Eyes: Negative for visual disturbance.  Respiratory: Negative for cough, shortness of breath and wheezing.   Cardiovascular: Negative for chest pain and palpitations.  Gastrointestinal: Negative for abdominal pain, diarrhea, nausea and vomiting.  Neurological: Negative for dizziness, light-headedness and headaches.    Allergies Patient has No Known Allergies.  Past Medical History Patient  has a past medical history of Basal cell carcinoma of skin and Left groin hernia.  Surgical History Patient  has a past surgical history that includes Excision basal cell carcinoma.  Family History Pateint's family  history includes Dementia in his maternal grandfather; Depression in his sister; Diabetes in his brother, maternal grandmother, and mother; Heart attack in his paternal grandmother; Hypothyroidism in his mother; Kidney disease in his brother; Lung cancer in his paternal grandfather; Schizophrenia in his brother; Stroke in his father.  Social History Patient  reports that he has never smoked. He has never used smokeless tobacco. He reports current alcohol use of about 2.0 standard drinks of alcohol per week. He reports that he does not use drugs.    Objective: Vitals:   12/13/19 1125  BP: 112/80  Pulse: (!) 103  Temp: 98.1 F (36.7 C)  TempSrc: Temporal  SpO2: 98%  Weight: 178 lb 3.2 oz (80.8 kg)  Height: 6' (1.829 m)    Body mass index is 24.17 kg/m.  Physical Exam Vitals reviewed.  Constitutional:      Appearance: Normal appearance. He is well-developed and normal weight.  HENT:     Head: Normocephalic and atraumatic.     Right Ear: Tympanic membrane, ear canal and external ear normal.     Left Ear: Tympanic membrane, ear canal and external ear normal.     Mouth/Throat:     Mouth: Mucous membranes are moist.  Eyes:     Conjunctiva/sclera: Conjunctivae normal.     Pupils: Pupils are equal, round, and reactive to light.  Neck:     Thyroid: No thyromegaly.     Vascular: No carotid bruit.     Comments: No thyromegaly  Cardiovascular:  Rate and Rhythm: Normal rate and regular rhythm.     Heart sounds: Normal heart sounds. No murmur heard.   Pulmonary:     Effort: Pulmonary effort is normal.     Breath sounds: Normal breath sounds.  Abdominal:     General: Bowel sounds are normal. There is no distension.     Palpations: Abdomen is soft.     Tenderness: There is no abdominal tenderness.  Musculoskeletal:     Cervical back: Normal range of motion and neck supple.  Lymphadenopathy:     Cervical: No cervical adenopathy.  Skin:    General: Skin is warm and dry.      Findings: No rash.  Neurological:     General: No focal deficit present.     Mental Status: He is alert and oriented to person, place, and time.     Cranial Nerves: No cranial nerve deficit.     Coordination: Coordination normal.     Deep Tendon Reflexes: Reflexes normal.  Psychiatric:        Mood and Affect: Mood normal.        Behavior: Behavior normal.        Assessment/plan: 1. Palpitations Has not had labs checked in some time, checking today. No palpitations in office today and do not feel like he needs ekg. Discussed if continues would take beta blocker and would get another zio. He is to let pcp know.  - CBC with Differential/Platelet - Comprehensive metabolic panel - TSH  2. Elevated blood pressure reading Normal in office. Recommended he could get a cuff and check at home and also recommended he have work recheck after he has sat for a few minutes to make sure accurate readings.   No other red flags on exam and reassured patient at this point. If symptoms continue asked he write them down and follow up with pcp.    This visit occurred during the SARS-CoV-2 public health emergency.  Safety protocols were in place, including screening questions prior to the visit, additional usage of staff PPE, and extensive cleaning of exam room while observing appropriate contact time as indicated for disinfecting solutions.     Return if symptoms worsen or fail to improve.   Orma Flaming, MD [place]   12/13/2019

## 2020-01-07 ENCOUNTER — Ambulatory Visit: Payer: BC Managed Care – PPO | Admitting: Family Medicine

## 2020-01-07 VITALS — BP 134/90 | HR 106

## 2020-01-07 DIAGNOSIS — S92912A Unspecified fracture of left toe(s), initial encounter for closed fracture: Secondary | ICD-10-CM

## 2020-01-07 NOTE — Progress Notes (Signed)
°  Subjective:     Patient ID: Casey Zavala, male   DOB: 04-Jul-1970, 49 y.o.   MRN: [id number]  HPI Casey Zavala [facility] today for follow up of his left 4th metatarsal fracture. He reports injury 5 weeks ago, he missed a step and landed on his left foot with his toes bent underneath him. He was able to ambulate after, had some swelling and bruising. He was evaluated at an urgent care on 11/29/19, x-ray showed non-displaced fracture. He was given a post-op shoe to wear for the next 4-6 weeks. He is here today because he is still having some pain and swelling after 5 weeks and has a vacation upcoming in 2 weeks that will require a lot of walking. He reports pain and swelling have improved but not totally gone, worse in the afternoons after he has been on his feet a lot. He continues to wear the post-op shoe but is no longer buddy taping or using ice. He did those initially. He denies any numbness or tingling. He is able to bear weight, still feels like he favors that foot though.   Past Medical History:  Diagnosis Date   Basal cell carcinoma of skin    Left groin hernia    actually improved with exercise   No Known Allergies Not taking any medications currently.   Review of Systems  Constitutional: Negative.   Musculoskeletal:       See HPI  Neurological: Negative.        Objective:   Physical Exam Vitals reviewed.  Constitutional:      General: He is not in acute distress.    Appearance: Normal appearance.  HENT:     Head: Normocephalic and atraumatic.  Pulmonary:     Effort: Pulmonary effort is normal. No respiratory distress.  Musculoskeletal:     Right foot: Normal.     Comments: Left foot: Mild non-pitting edema present to dorsal forefoot/midfoot area. Minimal ecchymosis present at third and fourth MTP joint. Tender to palpation over third and fourth MTP joint. ROM and sensation intact. Distal pulses 2+. Cap refill normal.   Skin:    General: Skin  is warm and dry.  Neurological:     Mental Status: He is alert and oriented to person, place, and time.  Psychiatric:        Mood and Affect: Mood normal.        Behavior: Behavior normal.    Today's Vitals   01/07/20 1326  BP: 134/90  Pulse: (!) 106  SpO2: 98%   There is no height or weight on file to calculate BMI.      Assessment:     Fracture of distal phalanx of toe of left foot      Plan:     1. Patient has seen improvement in pain and function since initial injury, however still experiencing discomfort especially later in the day or if he's been walking for some time. On exam still has some mild edema and tenderness with palpation. Recommend he continue to wear the post-op shoe. Also recommend resuming buddy-taping that toe to help with added stability and comfort. He may use ice prn. May also try epsom salt soak or compress for additional relief. Discussed that his symptoms should continue to improve over the next couple weeks, if they do not or symptoms worsen recommend f/u with PCP for further evaluation. May need additional imaging at that time to assess healing status.

## 2020-01-07 NOTE — Progress Notes (Deleted)
Sinus node problem - heart arrhythmia BP slightly elevated - trying to reduce salt.   5 week h/o foot injury - went to urgent care - 4 toe fracture -   Vacation in 2 weeks. Only pain with walking, worse in afternoon sometimes shooting pain. Feels better.   No numbness or tingling - ROM

## 2020-02-13 DIAGNOSIS — Z713 Dietary counseling and surveillance: Secondary | ICD-10-CM | POA: Diagnosis not present

## 2020-04-09 DIAGNOSIS — Z713 Dietary counseling and surveillance: Secondary | ICD-10-CM | POA: Diagnosis not present

## 2020-04-23 DIAGNOSIS — H5213 Myopia, bilateral: Secondary | ICD-10-CM | POA: Diagnosis not present

## 2020-05-19 ENCOUNTER — Encounter: Payer: Self-pay | Admitting: Internal Medicine

## 2020-05-19 ENCOUNTER — Ambulatory Visit: Payer: BC Managed Care – PPO | Admitting: Internal Medicine

## 2020-05-19 ENCOUNTER — Other Ambulatory Visit: Payer: Self-pay

## 2020-05-19 VITALS — BP 124/91 | HR 99 | Temp 97.9°F | Ht 72.0 in | Wt 181.0 lb

## 2020-05-19 DIAGNOSIS — R002 Palpitations: Secondary | ICD-10-CM

## 2020-05-19 DIAGNOSIS — R0989 Other specified symptoms and signs involving the circulatory and respiratory systems: Secondary | ICD-10-CM | POA: Diagnosis not present

## 2020-05-19 DIAGNOSIS — I480 Paroxysmal atrial fibrillation: Secondary | ICD-10-CM

## 2020-05-19 DIAGNOSIS — Z13220 Encounter for screening for lipoid disorders: Secondary | ICD-10-CM

## 2020-05-19 LAB — COMPREHENSIVE METABOLIC PANEL
ALT: 20 IU/L (ref 0–44)
AST: 20 IU/L (ref 0–40)
Albumin/Globulin Ratio: 1.7 (ref 1.2–2.2)
Albumin: 4.8 g/dL (ref 4.0–5.0)
Alkaline Phosphatase: 53 IU/L (ref 44–121)
BUN/Creatinine Ratio: 9 (ref 9–20)
BUN: 9 mg/dL (ref 6–24)
Bilirubin Total: 0.3 mg/dL (ref 0.0–1.2)
CO2: 28 mmol/L (ref 20–29)
Calcium: 10 mg/dL (ref 8.7–10.2)
Chloride: 101 mmol/L (ref 96–106)
Creatinine, Ser: 1.02 mg/dL (ref 0.76–1.27)
GFR calc Af Amer: 99 mL/min/{1.73_m2} (ref >59)
GFR calc non Af Amer: 86 mL/min/{1.73_m2} (ref >59)
Globulin, Total: 2.8 g/dL (ref 1.5–4.5)
Glucose: 90 mg/dL (ref 65–99)
Potassium: 5.5 mmol/L — ABNORMAL HIGH (ref 3.5–5.2)
Sodium: 140 mmol/L (ref 134–144)
Total Protein: 7.6 g/dL (ref 6.0–8.5)

## 2020-05-19 LAB — TSH: TSH: 1.29 u[IU]/mL (ref 0.450–4.500)

## 2020-05-19 LAB — LIPID PANEL
Chol/HDL Ratio: 3.4 ratio (ref 0.0–5.0)
Cholesterol, Total: 251 mg/dL — ABNORMAL HIGH (ref 100–199)
HDL: 74 mg/dL (ref >39)
LDL Chol Calc (NIH): 163 mg/dL — ABNORMAL HIGH (ref 0–99)
Triglycerides: 84 mg/dL (ref 0–149)
VLDL Cholesterol Cal: 14 mg/dL (ref 5–40)

## 2020-05-19 LAB — T4, FREE: Free T4: 1.31 ng/dL (ref 0.82–1.77)

## 2020-05-19 LAB — T3, FREE: T3, Free: 3.9 pg/mL (ref 2.0–4.4)

## 2020-05-19 MED ORDER — METOPROLOL SUCCINATE ER 25 MG PO TB24: ORAL_TABLET | ORAL | 3 refills | Status: DC

## 2020-05-19 NOTE — Progress Notes (Signed)
OFFICE NOTE  Chief Complaint:  Follow-up palpitations  Primary Care Physician: Marin Olp, MD  HPI:  Casey Zavala is a pleasant 49 year old male who currently presents for evaluation of tachycardia with alternating tachycardia. Casey Zavala is asymptomatic in remains active with regular exercise. He denies any chest pain or shortness of breath. He has no exercise intolerance. He's recently noted elevated heart rates for him which are in the 80s and 90s. Alternatively he has noted some heart rates in the 50s. EKG in the office today shows an ectopic atrial rhythm with short PR interval and mild junctional depression, heart rate 88. QTC is 450 ms. PR interval is 106 ms. He reports he is able to get his heart rate elevated with exercise. There is a family history of arrhythmia with his grandfather who had a pacemaker and father who apparently had "heart shocks" for a presumed arrhythmia.  04/10/2019  Casey Zavala is seen today for follow-up.  He was last seen in 2016 and therefore is considered a new patient.  The time he was having some unexplained tachycardia.  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  Recently he has reported some more shortness of breath and fatigue with exertion.  He is not currently taking any medications.  EKG shows a sinus rhythm at 97, however it was noted he had a history of tachycardia in the past.  He does also have significant anxiety.  He did have exercise treadmill stress testing in 2016 which was negative for ischemia and demonstrated a good exercise workload.  He also reports some episodes of palpitations which occur quite infrequently, in fact his last episode was more than a month ago.  05/19/2020  Casey Zavala returns today for follow-up.  He reports he has had about 5 episodes over the past year what he thinks are atrial fibrillation.  He does have an apple watch and showed me 2 episodes which do show irregularity but  not necessarily tachycardia.  I do not see clear P waves and these are suspicious for A. fib.  Overall his CHA2DS2-VASc score is 0 without any history of hypertension and young age therefore stroke risk should be low.  It is interesting that he mentioned that his heart rate used to be in the 50s or 60s several years ago and is slowly kind of crept up.  Is now more regularly in the 90s or 100s but occasionally drops down into the 60s.  He reports some feeling of tightness in his throat and significant anxiety.  I did note that labs were performed by his PCP in June 2021 and surprisingly his TSH was low at 1.0.  This seems on the low end of normal however per the reference range less than 0.35 is considered abnormal.  I wonder with his description of some tightness in his throat and tachycardia/palpitations whether he could have hyperthyroidism.  Of note, his mother actually had a thyroid disorder and required radioiodine ablation  PMHx:  Past Medical History:  Diagnosis Date  . Basal cell carcinoma of skin   . Left groin hernia    actually improved with exercise    Past Surgical History:  Procedure Laterality Date  . BASAL CELL CARCINOMA EXCISION      FAMHx:  Family History  Problem Relation Age of Onset  . Stroke Father   . Hypothyroidism Mother        also dementia  . Diabetes Mother   .  Depression Sister   . Diabetes Brother   . Schizophrenia Brother   . Kidney disease Brother   . Diabetes Maternal Grandmother   . Dementia Maternal Grandfather   . Heart attack Paternal Grandmother   . Lung cancer Paternal Grandfather     SOCHx:   reports that he has never smoked. He has never used smokeless tobacco. He reports current alcohol use of about 2.0 standard drinks of alcohol per week. He reports that he does not use drugs.  ALLERGIES:  No Known Allergies  ROS: A comprehensive review of systems was negative.  HOME MEDS: Current Outpatient Medications  Medication Sig Dispense  Refill  . busPIRone (BUSPAR) 5 MG tablet Take 1 tablet (5 mg total) by mouth 2 (two) times daily as needed (anxiety with travel). 10 tablet 0  . metoprolol tartrate (LOPRESSOR) 25 MG tablet Take 0.5-1 tablets (12.5-25 mg total) by mouth daily as needed (heart palpitations). 30 tablet 5   No current facility-administered medications for this visit.    LABS/IMAGING: No results found for this or any previous visit (from the past 48 hour(s)). No results found.  WEIGHTS: Wt Readings from Last 3 Encounters:  05/19/20 181 lb (82.1 kg)  12/13/19 178 lb 3.2 oz (80.8 kg)  05/27/19 177 lb 3.2 oz (80.4 kg)    VITALS: BP (!) 124/91   Pulse 99   Temp 97.9 F (36.6 C)   Ht 6' (1.829 m)   Wt 181 lb (82.1 kg)   SpO2 97%   BMI 24.55 kg/m   EXAM: General appearance: alert and no distress Neck: no carotid bruit, no JVD and thyroid: enlarged Lungs: clear to auscultation bilaterally Heart: regular rate and rhythm, S1, S2 normal, no murmur, click, rub or gallop Abdomen: soft, non-tender; bowel sounds normal; no masses,  no organomegaly and scaphoid Extremities: extremities normal, atraumatic, no cyanosis or edema Pulses: 2+ and symmetric Skin: Skin color, texture, turgor normal. No rashes or lesions Neurologic: Grossly normal Psych: Pleasant  EKG: Normal sinus rhythm at 99-personally reviewed  ASSESSMENT: 1. Throat tightness 2. Possible PAF (based on apple watch strips) - CHADSVASC score of 0 3. Borderline short PR interval, asymptomatic 4. Dyspnea/fatigue-negative exercise treadmill stress test in 2016 5. Palpitations 6. ?Hyperthyroidism  PLAN: 1.   Casey Zavala is describing throat tightness and has some significant anxiety.  This could be a globus sensation however is possible that he could be also hyperthyroid.  He noted that his mother had a thyroid disorder and had radioiodine ablation.  On exam his thyroid is prominent but he has a thin neck.  I cannot appreciate any clear  nodules.  His TSH is 1 which is on the low end of the normal range however the cut off is less than 0.35.  I would like to repeat that as well as a free T4 and T3 today.  We will get a metabolic profile and a lipid profile as well.  Because of this throat tightness and he is concerned about his family history of heart disease and he is interested in starting an exercise program, would like to repeat exercise treadmill stress testing.  His last study was in 2016 and was negative.  We will also plan to start Toprol-XL 12.5 mg nightly for suppression.  Follow-up in a couple months with me an his PCP as scheduled in December.  Pixie Casino, MD, [facility], [place] Director of the Advanced Lipid Disorders &  Cardiovascular Risk  Reduction Clinic Diplomate of the American Board of Clinical Lipidology Attending Cardiologist  Direct Dial: [phone]  Fax: [phone]  Website:  [email or web address] Edwin 05/19/2020, 8:17 AM

## 2020-05-19 NOTE — Patient Instructions (Addendum)
Medication Instructions:  STOP METOPROLOL AS NEEDED   START METOPROLOL SUC 25 MG 1/2 TABLET AT BEDTIME  YOU WILL NEED TO HOLD YOUR METOPROLOL NIGHT BEFORE STRESS TEST   *If you need a refill on your cardiac medications before your next appointment, please call your pharmacy*  Lab Work: TSH/FT4/FT3/LP/CMET  COVID SCREENING 3 DAYS PRIOR TO STRESS TEST   If you have labs (blood work) drawn today and your tests are completely normal, you will receive your results only by: Casey Zavala MyChart Message (if you have MyChart) OR . A paper copy in the mail If you have any lab test that is abnormal or we need to change your treatment, we will call you to review the results.  Testing/Procedures: Your physician has requested that you have an exercise tolerance test. For further information please visit [email or web address]. Please also follow instruction sheet, as given.  Follow-Up: At [facility], you and your health needs are our priority.  As part of our continuing mission to provide you with exceptional heart care, we have created designated Provider Care Teams.  These Care Teams include your primary Cardiologist (physician) and Advanced Practice Providers (APPs -  Physician Assistants and Nurse Practitioners) who all work together to provide you with the care you need, when you need it.  We recommend signing up for the patient portal called "MyChart".  Sign up information is provided on this After Visit Summary.  MyChart is used to connect with patients for Virtual Visits (Telemedicine).  Patients are able to view lab/test results, encounter notes, upcoming appointments, etc.  Non-urgent messages can be sent to your provider as well.   To learn more about what you can do with MyChart, go to [email or web address].    Your next appointment:   2 month(s)  The format for your next appointment:   In Person  Provider:   You may see DR HILTY  or one of the following Advanced Practice Providers on your  designated Care Team:    Almyra Deforest, PA-C  Fabian Sharp, PA-C or   Roby Lofts, PA-C  Other Instructions  Exercise Stress Test An exercise stress test is a test to check how your heart works during exercise. You will need to walk on a treadmill or ride an exercise bike for this test. An electrocardiogram (ECG) will record your heartbeat when you are at rest and when you are exercising. You may have an ultrasound or nuclear test after the exercise test. The test is done to check for coronary artery disease (CAD). It is also done to:  See how well you can exercise.  Watch for high blood pressure during exercise.  Test how well you can exercise after treatment.  Check the blood flow to your arms and legs. If your test result is not normal, more testing may be needed. What happens before the procedure?  Follow instructions from your doctor about what you cannot eat or drink. ? Do not have any drinks or foods that have caffeine in them for 24 hours before the test, or as told by your doctor. This includes coffee, tea (even decaf tea), sodas, chocolate, and cocoa.  Ask your doctor about changing or stopping your normal medicines. This is important if you: ? Take diabetes medicines. ? Take beta-blocker medicines. ? Wear a nitroglycerin patch.  If you use an inhaler, bring it with you to the test.  Do not put lotions, powders, creams, or oils on your chest before the test.  Wear comfortable shoes and  clothing.  Do not use any products that have nicotine or tobacco in them, such as cigarettes and e-cigarettes. Stop using them at least 4 hours before the test. If you need help quitting, ask your doctor. What happens during the procedure?   Patches (electrodes) will be put on your chest.  Wires will be connected to the patches. The wires will send signals to a machine to record your heartbeat.  Your heart rate will be watched while you are resting and while you are exercising. Your  blood pressure will also be watched during the test.  You will walk on a treadmill or use a stationary bike. If you cannot use these, you may be asked to turn a crank with your hands.  The activity will get harder and will raise your heart rate.  You may be asked to breathe into a tube a few times during the test. This measures the gases that you breathe out.  You will be asked how you are feeling throughout the test.  You will exercise until your heart reaches a target heart rate. You will stop early if: ? You feel dizzy. ? You have chest pain. ? You are out of breath. ? Your blood pressure is too high or too low. ? You have an irregular heartbeat. ? You have pain or aching in your arms or legs. The procedure may vary among doctors and hospitals. What happens after the procedure?  Your blood pressure, heart rate, breathing rate, and blood oxygen level will be watched after the test.  You may return to your normal diet and activities as told by your doctor.  It is up to you to get the results of your test. Ask your doctor, or the department that is doing the test, when your results will be ready. Summary  An exercise stress test is a test to check how your heart works during exercise.  This test is done to check for coronary artery disease.  Your heart rate will be watched while you are resting and while you are exercising.  Follow instructions from your doctor about what you cannot eat or drink before the test. This information is not intended to replace advice given to you by your health care provider. Make sure you discuss any questions you have with your health care provider. Document Revised: 09/18/2018 Document Reviewed: 09/06/2016 Elsevier Patient Education  [place].

## 2020-05-20 ENCOUNTER — Other Ambulatory Visit: Payer: Self-pay | Admitting: *Deleted

## 2020-05-20 DIAGNOSIS — Z79899 Other long term (current) drug therapy: Secondary | ICD-10-CM

## 2020-05-21 DIAGNOSIS — D225 Melanocytic nevi of trunk: Secondary | ICD-10-CM | POA: Diagnosis not present

## 2020-05-21 DIAGNOSIS — Z85828 Personal history of other malignant neoplasm of skin: Secondary | ICD-10-CM | POA: Diagnosis not present

## 2020-05-21 DIAGNOSIS — L570 Actinic keratosis: Secondary | ICD-10-CM | POA: Diagnosis not present

## 2020-05-21 DIAGNOSIS — L821 Other seborrheic keratosis: Secondary | ICD-10-CM | POA: Diagnosis not present

## 2020-05-21 DIAGNOSIS — D2271 Melanocytic nevi of right lower limb, including hip: Secondary | ICD-10-CM | POA: Diagnosis not present

## 2020-05-28 NOTE — Progress Notes (Signed)
Phone: [phone]    Subjective:  Patient presents today for their annual physical. Chief complaint-noted.   See problem oriented charting- ROS- full  review of systems was completed and negative  except for: palpitations  The following were reviewed and entered/updated in epic: Past Medical History:  Diagnosis Date  . Basal cell carcinoma of skin   . Left groin hernia    actually improved with exercise   Patient Active Problem List   Diagnosis Date Noted  . History of skin cancer 10/31/2017    Priority: Low  . Sinus node dysfunction ([place]) 09/06/2014    Priority: Low  . Hyperlipidemia 09/06/2014    Priority: Low  . Left groin hernia 01/17/2014    Priority: Low   Past Surgical History:  Procedure Laterality Date  . BASAL CELL CARCINOMA EXCISION      Family History  Problem Relation Age of Onset  . Stroke Father   . Hypothyroidism Mother        also dementia  . Diabetes Mother   . Depression Sister   . Diabetes Brother   . Schizophrenia Brother   . Kidney disease Brother   . Diabetes Maternal Grandmother   . Dementia Maternal Grandfather   . Heart attack Paternal Grandmother   . Lung cancer Paternal Grandfather     Medications- reviewed and updated Current Outpatient Medications  Medication Sig Dispense Refill  . busPIRone (BUSPAR) 5 MG tablet Take 1 tablet (5 mg total) by mouth 2 (two) times daily as needed (anxiety with travel). 10 tablet 0  . metoprolol succinate (TOPROL XL) 25 MG 24 hr tablet TAKE 1/2 TABLET AT BEDTIME 45 tablet 3   No current facility-administered medications for this visit.    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    [employer] background      Hobbies: exercising      Objective:  BP 122/84   Pulse 100   Temp 98.3 F (36.8 C) (Temporal)   Resp 18   Ht  6' (1.829 m)   Wt 175 lb 12.8 oz (79.7 kg)   SpO2 98%   BMI 23.84 kg/m  Gen: NAD, resting comfortably HEENT: Mucous membranes are moist. Oropharynx normal Neck: no thyromegaly CV: RRR no murmurs rubs or gallops Lungs: CTAB no crackles, wheeze, rhonchi Abdomen: soft/nontender/nondistended/normal bowel sounds. No rebound or guarding.  Ext: no edema Skin: warm, dry Neuro: grossly normal, moves all extremities, PERRLA    Assessment and Plan:  49 y.o. male presenting for annual physical.  Health Maintenance counseling: 1. Anticipatory guidance: Patient counseled regarding regular dental exams -q6 months, eye exams - yearly,  avoiding smoking and second hand smoke , limiting alcohol to 2 beverages per day-on average 1-2 every other day- now not drinking at all on metoprolol- discussed if does 1 glass of wine at dinner then metoprolol before bed probably ok.   2. Risk factor reduction:  Advised patient of need for regular exercise and diet rich and fruits and vegetables to reduce risk of heart attack and stroke. Exercise-last year he was worried that his heart rate being elevated and wanted to ease back-today wanting to start back but waiting on stress test. Diet-he reports was working with a dietitian to work previously and had cut out diet soda's as well as tried to increase  vegetables and fish- still working with them and trying to get cholesterol down  Through work program. Down 2 lbs from last year- had been down to 160s on home scales - he feels better at current weight Wt Readings from Last 3 Encounters:  06/01/20 175 lb 12.8 oz (79.7 kg)  05/19/20 181 lb (82.1 kg)  12/13/19 178 lb 3.2 oz (80.8 kg)  3. Immunizations/screenings/ancillary studies-has already received flu shot at his place of employment.  Discussed one-time one-time hepatitis C screening  Immunization History  Administered Date(s) Administered  . Influenza Split 05/31/2011  . Influenza Whole 04/02/2008, 04/22/2009  .  Influenza,inj,Quad PF,6+ Mos 05/27/2019  . Influenza-Unspecified 04/20/2017, 04/23/2018, 05/01/2020  . Moderna Sars-Covid-2 Vaccination 06/24/2019, 07/22/2019, 05/22/2020  . Td 06/20/1996, 02/09/2007  . Tdap 05/17/2017  4. Prostate cancer screening-  No family history, start at age 74  5. Colon cancer screening - referred patient last year due to new guidelines at age 31-insurance will only cover at 57- set reminder to refer him in April.   6. Skin cancer screening/prevention-seen by dermatology every 6 months due to history of skin cancer. advised regular sunscreen use. Denies worrisome, changing, or new skin lesions.  7. Testicular cancer screening- advised monthly self exams -he does use at least every few months 8. STD screening- patient opts out-only active with his wife 9.  Never smoker  Status of chronic or acute concerns   # social update- a lot of stress with work still particularly back and forths with covid and staffing issues  #hyperlipidemia S: Medication:None. Doing metamucil at night to see if that helps with cholesterol.  Lab Results  Component Value Date   CHOL 251 (H) 05/19/2020   HDL 74 05/19/2020   LDLCALC 163 (H) 05/19/2020   TRIG 84 05/19/2020   CHOLHDL 3.4 05/19/2020   A/P: Patient had full lipid panel 2 weeks ago.  10-year ASCVD risk only 2.7%-would not recommend statin at this time. I like he is working on diet and hoping after stress test can increase his exercise.   #Sinus node dysfunction/heart palpitations S:Started seeing Dr. Debara Pickett in 2020 due to worsening palpitations.  Was also having some dyspnea and fatigue but was not getting regular exercise and was under a lot of stress.  Had a negative treadmill stress test in 2016.  They opted to try as needed metoprolol.  Since last visit with me -patient had a visit with Dr. Rogers Blocker in June 2021 and he had been having more palpitations-plan was to start using metoprolol again and consider Zio patch.  Ultimately  patient saw Dr. Debara Pickett on May 19, 2020.  Patient reported throat tightness-TSH, T3, T4 were normal. Had one episode of palpitations in October that lasted 12 hours. Throat sensation listed above is not associated with exercise. Not persistent and not worsening. Not associated with meals  Patient has upcoming exercise tolerance test on December 21.  Patient wants to start an exercise program but wants to rule out cardiac disease before starting exercise regimen.  For palpitations and started on metoprolol 12.5 mg nightly instead of as needed. Continues to avoid sodas. Has cut out alcohol  A/P: appropriate workup with Dr. Malka So episodes since being on metoprolol. I think 1 glass of wine a night would be ok with dinner since he takes at [place].    #Left groin hernia-evaluated by surgery in the past but was improving at that time.  No recurrent bulge in the groin.  #Hyperglycemia-point-of-care A1c only at 5.4 in the  past.  Family history of diabetes-we will trend fasting a1c.  Lab Results  Component Value Date   HGBA1C point-of-care 5.4 05/27/2019   Recommended follow up: Return in about 1 year (around 06/01/2021) for physical or sooner if needed. Future Appointments  Date Time Provider [place]  06/05/2020  3:10 PM MC-SCREENING [facility]-SDSC None  06/09/2020  9:45 AM MC-CV NL NUC MED MC-SECVI [facility]  07/14/2020  3:15 PM Hilty, Nadean Corwin, MD [facility]   Lab/Order associations: fasting   ICD-10-CM   1. Preventative health care  Z00.00 Hepatitis C antibody    CBC With Differential/Platelet    BASIC METABOLIC PANEL WITH GFR  2. Hyperlipidemia, unspecified hyperlipidemia type  E78.5 CBC With Differential/Platelet    BASIC METABOLIC PANEL WITH GFR  3. Encounter for hepatitis C screening test for low risk patient  Z11.59 Hepatitis C antibody  4. Sinus node dysfunction (HCC)  I49.5   5. Hyperkalemia  H70.2 BASIC METABOLIC PANEL WITH GFR   No orders of the defined types were  placed in this encounter.   Return precautions advised.   Garret Reddish, MD

## 2020-05-28 NOTE — Patient Instructions (Addendum)
Please stop by lab before you go If you have mychart- we will send your results within 3 business days of [place] receiving them.  If you do not have mychart- we will call you about results within 5 business days of [place] receiving them.  *please note we are currently using [facility] which has a longer processing time than [place] typically so labs may not come back as quickly as in the past *please also note that you will see labs on mychart as soon as they post. I will later go in and write notes on them- will say "notes from Dr. Yong Channel"   Glad you are doing well! Hoping or a reassuring stress test and then get you back on the exercise plan. [email or web address] if you are interested after the stress test as long as it looks good

## 2020-05-29 ENCOUNTER — Other Ambulatory Visit: Payer: Self-pay | Admitting: *Deleted

## 2020-05-29 DIAGNOSIS — Z79899 Other long term (current) drug therapy: Secondary | ICD-10-CM

## 2020-06-01 ENCOUNTER — Encounter: Payer: Self-pay | Admitting: Family Medicine

## 2020-06-01 ENCOUNTER — Other Ambulatory Visit: Payer: Self-pay

## 2020-06-01 ENCOUNTER — Ambulatory Visit (INDEPENDENT_AMBULATORY_CARE_PROVIDER_SITE_OTHER): Payer: BC Managed Care – PPO | Admitting: Family Medicine

## 2020-06-01 VITALS — BP 122/84 | HR 100 | Temp 98.3°F | Resp 18 | Ht 72.0 in | Wt 175.8 lb

## 2020-06-01 DIAGNOSIS — E785 Hyperlipidemia, unspecified: Secondary | ICD-10-CM

## 2020-06-01 DIAGNOSIS — Z1159 Encounter for screening for other viral diseases: Secondary | ICD-10-CM | POA: Diagnosis not present

## 2020-06-01 DIAGNOSIS — Z0000 Encounter for general adult medical examination without abnormal findings: Secondary | ICD-10-CM

## 2020-06-01 DIAGNOSIS — E875 Hyperkalemia: Secondary | ICD-10-CM

## 2020-06-01 DIAGNOSIS — I495 Sick sinus syndrome: Secondary | ICD-10-CM

## 2020-06-02 LAB — BASIC METABOLIC PANEL WITH GFR
BUN: 13 mg/dL (ref 7–25)
CO2: 30 mmol/L (ref 20–32)
Calcium: 9.6 mg/dL (ref 8.6–10.3)
Chloride: 103 mmol/L (ref 98–110)
Creat: 0.93 mg/dL (ref 0.60–1.35)
GFR, Est African American: 111 mL/min/{1.73_m2} (ref >=60)
GFR, Est Non African American: 96 mL/min/{1.73_m2} (ref >=60)
Glucose, Bld: 88 mg/dL (ref 65–99)
Potassium: 4.4 mmol/L (ref 3.5–5.3)
Sodium: 140 mmol/L (ref 135–146)

## 2020-06-02 LAB — CBC WITH DIFFERENTIAL/PLATELET
Absolute Monocytes: 533 cells/uL (ref 200–950)
Basophils Absolute: 43 cells/uL (ref 0–200)
Basophils Relative: 0.6 %
Eosinophils Absolute: 114 cells/uL (ref 15–500)
Eosinophils Relative: 1.6 %
HCT: 43.7 % (ref 38.5–50.0)
Hemoglobin: 14.7 g/dL (ref 13.2–17.1)
Lymphs Abs: 1484 cells/uL (ref 850–3900)
MCH: 28.8 pg (ref 27.0–33.0)
MCHC: 33.6 g/dL (ref 32.0–36.0)
MCV: 85.7 fL (ref 80.0–100.0)
MPV: 9.6 fL (ref 7.5–12.5)
Monocytes Relative: 7.5 %
Neutro Abs: 4927 cells/uL (ref 1500–7800)
Neutrophils Relative %: 69.4 %
Platelets: 318 10*3/uL (ref 140–400)
RBC: 5.1 10*6/uL (ref 4.20–5.80)
RDW: 12.4 % (ref 11.0–15.0)
Total Lymphocyte: 20.9 %
WBC: 7.1 10*3/uL (ref 3.8–10.8)

## 2020-06-02 LAB — HEPATITIS C ANTIBODY
Hepatitis C Ab: NONREACTIVE
SIGNAL TO CUT-OFF: 0.34 (ref <1.00)

## 2020-06-05 ENCOUNTER — Other Ambulatory Visit (HOSPITAL_COMMUNITY)
Admission: RE | Admit: 2020-06-05 | Discharge: 2020-06-05 | Disposition: A | Discharge status: Home / Self Care | Payer: BC Managed Care – PPO | Source: Ambulatory Visit | Attending: Cardiovascular Disease | Admitting: Cardiovascular Disease

## 2020-06-05 DIAGNOSIS — Z01812 Encounter for preprocedural laboratory examination: Secondary | ICD-10-CM | POA: Diagnosis not present

## 2020-06-05 DIAGNOSIS — U071 COVID-19: Secondary | ICD-10-CM | POA: Diagnosis not present

## 2020-06-06 ENCOUNTER — Other Ambulatory Visit: Payer: Self-pay | Admitting: Medical

## 2020-06-06 ENCOUNTER — Telehealth: Payer: Self-pay | Admitting: Medical

## 2020-06-06 LAB — SARS CORONAVIRUS 2 (TAT 6-24 HRS): SARS Coronavirus 2: POSITIVE — AB

## 2020-06-06 NOTE — Progress Notes (Signed)
Notified Cadence Furth, [place] that patient Pre -Procedure Covid test on 12/17 was +.  She will notify the patient

## 2020-06-06 NOTE — Telephone Encounter (Signed)
I called the patient to inform him about COVID + test. He was scheduled for a stress test 12/21. Patient is feeling fine, asymptomatic. Will let Dr. Debara Pickett. Will also send to Triage to cancel and  possibly re-schedule.  Casey Burgert Kathlen Mody, PA-C

## 2020-06-08 ENCOUNTER — Telehealth: Payer: Self-pay

## 2020-06-08 NOTE — Telephone Encounter (Signed)
Patient called about his Positive test results. I did advise him that the test results were in fact positive and I don't believe that they were a false positive but he can go get retested if he would like to. He gave a verbal understanding and disconnected the call.

## 2020-06-08 NOTE — Telephone Encounter (Signed)
Patient has been r/s to 06/18/2020

## 2020-06-08 NOTE — Telephone Encounter (Signed)
Message sent to scheduling to cancel stress test scheduled for 12/21 and reschedule.

## 2020-06-08 NOTE — Telephone Encounter (Signed)
Sent to MD as FYI

## 2020-06-09 ENCOUNTER — Other Ambulatory Visit: Payer: BC Managed Care – PPO

## 2020-06-09 ENCOUNTER — Ambulatory Visit (HOSPITAL_COMMUNITY)
Admission: RE | Admit: 2020-06-09 | Payer: BC Managed Care – PPO | Source: Ambulatory Visit | Attending: Internal Medicine | Admitting: Internal Medicine

## 2020-06-09 DIAGNOSIS — Z20822 Contact with and (suspected) exposure to covid-19: Secondary | ICD-10-CM | POA: Diagnosis not present

## 2020-06-09 NOTE — Telephone Encounter (Signed)
Patient states that he doesn't want a visit, He feels fine! He also states that he feels his test results are a "false positive" so he went and got another test done so he is waiting for his results to come back. States he will give [place] a call here int he office once they're back.

## 2020-06-09 NOTE — Telephone Encounter (Signed)
Thanks .. fortunately this was weeks after I saw him.   Dr Lemmie Evens

## 2020-06-09 NOTE — Telephone Encounter (Signed)
Thanks so much Dr. Debara Pickett.  Team-please call patient to see if he would like a visit or if he feels comfortable managing/quarantining

## 2020-06-11 LAB — SARS-COV-2, NAA 2 DAY TAT

## 2020-06-11 LAB — NOVEL CORONAVIRUS, NAA: SARS-CoV-2, NAA: NOT DETECTED

## 2020-06-16 ENCOUNTER — Telehealth (HOSPITAL_COMMUNITY): Payer: Self-pay | Admitting: *Deleted

## 2020-06-16 ENCOUNTER — Encounter (HOSPITAL_COMMUNITY): Payer: Self-pay | Admitting: *Deleted

## 2020-06-16 NOTE — Telephone Encounter (Signed)
Close encounter 

## 2020-06-16 NOTE — Progress Notes (Signed)
Pt had a Covid test on Dec 17 in preparation for an ETT. Covid test was positive and ETT canceled. Pt had another Covid test on Dec 21 that was negative. Per Dr Bjorn Pippin, pt does not need another Covid test prior to his ETT which is scheduled for Jun 18, 2020

## 2020-06-18 ENCOUNTER — Ambulatory Visit (HOSPITAL_COMMUNITY)
Admission: RE | Admit: 2020-06-18 | Discharge: 2020-06-18 | Disposition: A | Discharge status: Home / Self Care | Payer: BC Managed Care – PPO | Source: Ambulatory Visit | Attending: Cardiology | Admitting: Cardiology

## 2020-06-18 ENCOUNTER — Other Ambulatory Visit: Payer: Self-pay

## 2020-06-18 DIAGNOSIS — I480 Paroxysmal atrial fibrillation: Secondary | ICD-10-CM | POA: Diagnosis not present

## 2020-06-18 DIAGNOSIS — R0989 Other specified symptoms and signs involving the circulatory and respiratory systems: Secondary | ICD-10-CM | POA: Diagnosis not present

## 2020-06-18 DIAGNOSIS — R002 Palpitations: Secondary | ICD-10-CM

## 2020-06-18 LAB — EXERCISE TOLERANCE TEST
Estimated workload: 17.2 METS
Exercise duration (min): 14 min
Exercise duration (sec): 1 s
MPHR: 171 {beats}/min
Peak HR: 184 {beats}/min
Percent HR: 107 %
Rest HR: 103 {beats}/min

## 2020-07-09 DIAGNOSIS — Z713 Dietary counseling and surveillance: Secondary | ICD-10-CM | POA: Diagnosis not present

## 2020-07-14 ENCOUNTER — Encounter: Payer: Self-pay | Admitting: Internal Medicine

## 2020-07-14 ENCOUNTER — Ambulatory Visit: Payer: BC Managed Care – PPO | Admitting: Internal Medicine

## 2020-07-14 ENCOUNTER — Other Ambulatory Visit: Payer: Self-pay

## 2020-07-14 VITALS — BP 110/60 | HR 98 | Ht 72.0 in | Wt 177.4 lb

## 2020-07-14 DIAGNOSIS — R002 Palpitations: Secondary | ICD-10-CM | POA: Diagnosis not present

## 2020-07-14 DIAGNOSIS — R0989 Other specified symptoms and signs involving the circulatory and respiratory systems: Secondary | ICD-10-CM | POA: Diagnosis not present

## 2020-07-14 DIAGNOSIS — Z13220 Encounter for screening for lipoid disorders: Secondary | ICD-10-CM | POA: Diagnosis not present

## 2020-07-14 NOTE — Progress Notes (Signed)
OFFICE NOTE  Chief Complaint:  Follow-up palpitations  Primary Care Physician: Marin Olp, MD  HPI:  Casey Zavala is a pleasant 50 year old male who currently presents for evaluation of tachycardia with alternating tachycardia. Casey Zavala is asymptomatic in remains active with regular exercise. He denies any chest pain or shortness of breath. He has no exercise intolerance. He's recently noted elevated heart rates for him which are in the 80s and 90s. Alternatively he has noted some heart rates in the 50s. EKG in the office today shows an ectopic atrial rhythm with short PR interval and mild junctional depression, heart rate 88. QTC is 450 ms. PR interval is 106 ms. He reports he is able to get his heart rate elevated with exercise. There is a family history of arrhythmia with his grandfather who had a pacemaker and father who apparently had "heart shocks" for a presumed arrhythmia.  04/10/2019  Casey Zavala is seen today for follow-up.  He was last seen in 2016 and therefore is considered a new patient.  The time he was having some unexplained tachycardia.  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  Recently he has reported some more shortness of breath and fatigue with exertion.  He is not currently taking any medications.  EKG shows a sinus rhythm at 97, however it was noted he had a history of tachycardia in the past.  He does also have significant anxiety.  He did have exercise treadmill stress testing in 2016 which was negative for ischemia and demonstrated a good exercise workload.  He also reports some episodes of palpitations which occur quite infrequently, in fact his last episode was more than a month ago.  05/19/2020  Casey Zavala returns today for follow-up.  He reports he has had about 5 episodes over the past year what he thinks are atrial fibrillation.  He does have an apple watch and showed me 2 episodes which do show irregularity but  not necessarily tachycardia.  I do not see clear P waves and these are suspicious for A. fib.  Overall his CHA2DS2-VASc score is 0 without any history of hypertension and young age therefore stroke risk should be low.  It is interesting that he mentioned that his heart rate used to be in the 50s or 60s several years ago and is slowly kind of crept up.  Is now more regularly in the 90s or 100s but occasionally drops down into the 60s.  He reports some feeling of tightness in his throat and significant anxiety.  I did note that labs were performed by his PCP in June 2021 and surprisingly his TSH was low at 1.0.  This seems on the low end of normal however per the reference range less than 0.35 is considered abnormal.  I wonder with his description of some tightness in his throat and tachycardia/palpitations whether he could have hyperthyroidism.  Of note, his mother actually had a thyroid disorder and required radioiodine ablation.  07/14/2020  Casey Zavala is seen today in follow-up.  He reports he is doing better with respect to his tachycardia.  He says he has only noted one episode of elevated heart rate and generally feels much better on low-dose metoprolol.  He underwent treadmill exercise stress testing and was able to achieve 17 metabolic equivalents of exercise which is significant.  No ischemic changes were noted.  Overall this is low risk and he should be able to exercise  without any concerns.  His thyroid testing was negative.  The only abnormal finding was his cholesterol was elevated 251 total, triglycerides 84, HDL 74 and an LDL of 163.  He is ready discussed this with his PCP.  They are planning some OTC remedies.  Interestingly with his high HDL cholesterol that may actually be cardioprotective and to some extent may offset his high LDL cholesterol.  Nonetheless I would say even if at low risk he should probably target an LDL less than 130 which he may be able to achieve with diet and  exercise.  PMHx:  Past Medical History:  Diagnosis Date  . Basal cell carcinoma of skin   . Left groin hernia    actually improved with exercise    Past Surgical History:  Procedure Laterality Date  . BASAL CELL CARCINOMA EXCISION      FAMHx:  Family History  Problem Relation Age of Onset  . Stroke Father   . Hypothyroidism Mother        also dementia  . Diabetes Mother   . Depression Sister   . Diabetes Brother   . Schizophrenia Brother   . Kidney disease Brother   . Diabetes Maternal Grandmother   . Dementia Maternal Grandfather   . Heart attack Paternal Grandmother   . Lung cancer Paternal Grandfather     SOCHx:   reports that he has never smoked. He has never used smokeless tobacco. He reports current alcohol use of about 2.0 standard drinks of alcohol per week. He reports that he does not use drugs.  ALLERGIES:  No Known Allergies  ROS: A comprehensive review of systems was negative.  HOME MEDS: Current Outpatient Medications  Medication Sig Dispense Refill  . busPIRone (BUSPAR) 5 MG tablet Take 1 tablet (5 mg total) by mouth 2 (two) times daily as needed (anxiety with travel). 10 tablet 0  . metoprolol succinate (TOPROL XL) 25 MG 24 hr tablet TAKE 1/2 TABLET AT BEDTIME 45 tablet 3   No current facility-administered medications for this visit.    LABS/IMAGING: No results found for this or any previous visit (from the past 48 hour(s)). No results found.  WEIGHTS: Wt Readings from Last 3 Encounters:  07/14/20 177 lb 6.4 oz (80.5 kg)  06/01/20 175 lb 12.8 oz (79.7 kg)  05/19/20 181 lb (82.1 kg)    VITALS: BP 110/60   Pulse 98   Ht 6' (1.829 m)   Wt 177 lb 6.4 oz (80.5 kg)   BMI 24.06 kg/m   EXAM: Deferred  EKG: Deferred  ASSESSMENT: 1. Throat tightness -resolved, ?anxiety, palpitations 2. Possible PAF (based on apple watch strips) - CHADSVASC score of 0 3. Borderline short PR interval, asymptomatic 4. Dyspnea/fatigue-negative exercise  treadmill stress test (05/2020)  PLAN: 1.   Casey Zavala is doing much better on low-dose beta-blocker.  He reports only one episode of elevated heart rate.  In general his throat tightness is resolved some this might have been anxiety or globus sensation.  It could have been related to palpitations.  He did very well on his exercise stress test which confers a low risk.  He does have a dyslipidemia with LDL over 160 however his HDL is high and may very well offset that if his HDL is cardioprotective.  It is reasonable to consider his dietary modifications to try to target an LDL less than 130 however does not sound like there is any early onset heart disease in the family.  Follow-up with me  in November per his request.  Pixie Casino, MD, FACC, [place] Director of the [facility]  [facility] Diplomate of the American Board of Clinical Lipidology Attending Cardiologist  Direct Dial: [phone]  Fax: [phone]  Website:  [email or web address] 07/14/2020, 3:40 PM

## 2020-07-14 NOTE — Patient Instructions (Signed)
  Follow-Up: At [facility], you and your health needs are our priority.  As part of our continuing mission to provide you with exceptional heart care, we have created designated Provider Care Teams.  These Care Teams include your primary Cardiologist (physician) and Advanced Practice Providers (APPs -  Physician Assistants and Nurse Practitioners) who all work together to provide you with the care you need, when you need it.  We recommend signing up for the patient portal called "MyChart".  Sign up information is provided on this After Visit Summary.  MyChart is used to connect with patients for Virtual Visits (Telemedicine).  Patients are able to view lab/test results, encounter notes, upcoming appointments, etc.  Non-urgent messages can be sent to your provider as well.   To learn more about what you can do with MyChart, go to [email or web address].    Your next appointment:   10 month(s)  The format for your next appointment:   In Person  Provider:   K. Mali Hilty, MD

## 2020-08-04 DIAGNOSIS — H5213 Myopia, bilateral: Secondary | ICD-10-CM | POA: Diagnosis not present

## 2020-08-11 ENCOUNTER — Ambulatory Visit: Payer: BC Managed Care – PPO | Admitting: Cardiology

## 2020-08-27 ENCOUNTER — Encounter: Payer: Self-pay | Admitting: Family Medicine

## 2020-08-27 NOTE — Telephone Encounter (Signed)
Form printed

## 2020-09-23 DIAGNOSIS — Z713 Dietary counseling and surveillance: Secondary | ICD-10-CM | POA: Diagnosis not present

## 2020-10-29 ENCOUNTER — Other Ambulatory Visit: Payer: Self-pay

## 2020-10-29 ENCOUNTER — Telehealth: Payer: Self-pay

## 2020-10-29 DIAGNOSIS — Z1211 Encounter for screening for malignant neoplasm of colon: Secondary | ICD-10-CM

## 2020-10-29 NOTE — Telephone Encounter (Signed)
-----   Message from Marin Olp, MD sent at 10/29/2020 12:52 PM EDT ----- Team please refer to GI under screening for colon cancer-patient is due for colonoscopy.  Please let him know you will be receiving a call about this within 2 weeks and if he does not he can give GIs office a call directly-please provide the number

## 2020-10-29 NOTE — Telephone Encounter (Signed)
Called and lm on pt vm making him aware that GI referral has been placed.

## 2020-11-30 DIAGNOSIS — D0422 Carcinoma in situ of skin of left ear and external auricular canal: Secondary | ICD-10-CM | POA: Diagnosis not present

## 2020-11-30 DIAGNOSIS — L821 Other seborrheic keratosis: Secondary | ICD-10-CM | POA: Diagnosis not present

## 2020-11-30 DIAGNOSIS — D225 Melanocytic nevi of trunk: Secondary | ICD-10-CM | POA: Diagnosis not present

## 2020-11-30 DIAGNOSIS — Z85828 Personal history of other malignant neoplasm of skin: Secondary | ICD-10-CM | POA: Diagnosis not present

## 2020-11-30 DIAGNOSIS — L814 Other melanin hyperpigmentation: Secondary | ICD-10-CM | POA: Diagnosis not present

## 2020-12-03 DIAGNOSIS — H5213 Myopia, bilateral: Secondary | ICD-10-CM | POA: Diagnosis not present

## 2021-01-06 MED ORDER — METOPROLOL SUCCINATE ER 25 MG PO TB24: 25.0000 mg | ORAL_TABLET | Freq: Every day | ORAL | 3 refills | Status: DC

## 2021-01-06 NOTE — Telephone Encounter (Signed)
Metoprolol succinate 25mg  refilled per 10/13/20 MyChart message communication from MD (copied)  From Dr. Debara Pickett:   It does look like afib. He still has a CHADSVASC score of 0, so does not need to be anticoagulated. Could increase the Toprol XL to 25 mg (1 full tablet) at bedtime for better control.   Dr. Lemmie Evens

## 2021-02-16 ENCOUNTER — Ambulatory Visit: Payer: BC Managed Care – PPO | Admitting: Family Medicine

## 2021-02-23 ENCOUNTER — Ambulatory Visit: Payer: BC Managed Care – PPO | Admitting: Family Medicine

## 2021-02-23 VITALS — BP 128/94 | HR 107

## 2021-02-23 DIAGNOSIS — R030 Elevated blood-pressure reading, without diagnosis of hypertension: Secondary | ICD-10-CM

## 2021-02-23 NOTE — Progress Notes (Signed)
Subjective:     Patient ID: Casey Zavala, male   DOB: Sep 09, 1970, 50 y.o.   MRN: [id number]  HPI Casey Zavala presents to the [facility] and wellness clinic today for a BP check and discussion of lifestyle modifications to help with BP and cholesterol reduction. He does see a cardiologist for A. Fib and takes metoprolol, 25 mg, daily. This has helped keep his A fib episodes under better control. He reports his BP has been slowly increasing over the last couple years as well as his cholesterol. He states at times he has a "sense" that something isn't right and his BP will be somewhat elevated, at work last week it was 150/90. He also reports over the weekend the left side of his face felt tingly, but has resolved, and BP at the time was 130/90. He denies any chest pain or shortness of breath, no headaches or vision changes. According to his cardiologist his CHADVASC score is 0 and does not need anticoagulation at this time. He is interested in lifestyle changes to help with keeping his BP and cholesterol under better control. His diet is good but he does not exercise.   Past Medical History:  Diagnosis Date   Basal cell carcinoma of skin    Left groin hernia    actually improved with exercise   No Known Allergies  Current Outpatient Medications:    psyllium (METAMUCIL) 58.6 % powder, Take 1 packet by mouth daily., Disp: , Rfl:    Red Yeast Rice Extract (RED YEAST RICE PO), Take by mouth., Disp: , Rfl:    busPIRone (BUSPAR) 5 MG tablet, Take 1 tablet (5 mg total) by mouth 2 (two) times daily as needed (anxiety with travel)., Disp: 10 tablet, Rfl: 0   metoprolol succinate (TOPROL XL) 25 MG 24 hr tablet, Take 1 tablet (25 mg total) by mouth daily. TAKE 1/2 TABLET AT BEDTIME, Disp: 90 tablet, Rfl: 3      Review of Systems  Constitutional:  Negative for chills, fatigue, fever and unexpected weight change.  HENT:  Negative for congestion, ear pain, sinus pressure, sinus pain and sore throat.    Eyes:  Negative for discharge and visual disturbance.  Respiratory:  Negative for cough, shortness of breath and wheezing.   Cardiovascular:  Positive for palpitations. Negative for chest pain and leg swelling.  Gastrointestinal:  Negative for abdominal pain, blood in stool, constipation, diarrhea, nausea and vomiting.  Genitourinary:  Negative for difficulty urinating and hematuria.  Skin:  Negative for color change.  Neurological:  Negative for dizziness, weakness, light-headedness and headaches.  Hematological:  Negative for adenopathy.  All other systems reviewed and are negative.     Objective:   Physical Exam Vitals reviewed.  Constitutional:      General: He is not in acute distress.    Appearance: Normal appearance. He is well-developed.  HENT:     Head: Normocephalic and atraumatic.  Eyes:     General:        Right eye: No discharge.        Left eye: No discharge.  Cardiovascular:     Rate and Rhythm: Regular rhythm. Tachycardia present.     Heart sounds: Normal heart sounds.  Pulmonary:     Effort: Pulmonary effort is normal. No respiratory distress.     Breath sounds: Normal breath sounds.  Musculoskeletal:     Cervical back: Neck supple.  Skin:    General: Skin is warm and dry.  Neurological:  Mental Status: He is alert and oriented to person, place, and time.  Psychiatric:        Mood and Affect: Mood normal.        Behavior: Behavior normal.  Today's Vitals   02/23/21 1508  BP: (!) 128/94  Pulse: (!) 107  SpO2: 98%   There is no height or weight on file to calculate BMI.      Assessment:     Elevated BP without diagnosis of hypertension     Plan:     Discussed with pt plan for lifestyle changes to help manage BP and cholesterol. Encouraged setting small achievable goals. Discussed importance of exercise, healthy diet, and decreasing stress. He wants to start with incorporating exercise. Recommend f/u in 1 month, can recheck BP at that time and  f/u on his goals. Should he develop significantly elevated BP, stroke symptoms or any other concerning symptoms he should be seen at ED

## 2021-03-09 NOTE — Telephone Encounter (Signed)
Would try to get him an earlier appt - can be with afib clinic if they have availability. He will probably need an anti-arrhythmic medicine.  Dr Lemmie Evens

## 2021-03-12 ENCOUNTER — Other Ambulatory Visit: Payer: Self-pay

## 2021-03-12 ENCOUNTER — Ambulatory Visit (HOSPITAL_COMMUNITY)
Admission: RE | Admit: 2021-03-12 | Discharge: 2021-03-12 | Disposition: A | Discharge status: Home / Self Care | Payer: BC Managed Care – PPO | Source: Ambulatory Visit | Attending: Physician Assistant | Admitting: Physician Assistant

## 2021-03-12 VITALS — BP 124/94 | HR 112 | Ht 72.0 in | Wt 178.2 lb

## 2021-03-12 DIAGNOSIS — Z8249 Family history of ischemic heart disease and other diseases of the circulatory system: Secondary | ICD-10-CM | POA: Diagnosis not present

## 2021-03-12 DIAGNOSIS — I480 Paroxysmal atrial fibrillation: Secondary | ICD-10-CM | POA: Insufficient documentation

## 2021-03-12 MED ORDER — METOPROLOL SUCCINATE ER 50 MG PO TB24: 50.0000 mg | ORAL_TABLET | Freq: Every day | ORAL | 3 refills | Status: DC

## 2021-03-12 MED ORDER — METOPROLOL SUCCINATE ER 25 MG PO TB24: ORAL_TABLET | ORAL | Status: DC

## 2021-03-12 NOTE — Progress Notes (Signed)
Primary Care Physician: Marin Olp, MD Primary Cardiologist: Dr Debara Pickett Primary Electrophysiologist: none Referring Physician: Dr Vicente Males is a 50 y.o. male with a history of atrial fibrillation who presents for consultation in the [facility].  The patient was initially diagnosed with atrial fibrillation 04/2020 on his smart watch. Patient has a CHADS2VASC score of 0. He has noticed more frequent irregular rhythm and heart racing despite increasing metoprolol, 5-6 episodes. His longest episode lasted 8 hours. When in afib he "feels off" and does have palpitations.   Today, he denies symptoms of chest pain, shortness of breath, orthopnea, PND, lower extremity edema, dizziness, presyncope, syncope, snoring, daytime somnolence, bleeding, or neurologic sequela. The patient is tolerating medications without difficulties and is otherwise without complaint today.    Atrial Fibrillation Risk Factors:  he does not have symptoms or diagnosis of sleep apnea. he does not have a history of rheumatic fever. he does have a history of alcohol use. The patient does have a history of early familial atrial fibrillation or other arrhythmias. Father has afib.  he has a BMI of Body mass index is 24.17 kg/m.Marland Kitchen Filed Weights   03/12/21 1105  Weight: 80.8 kg    Family History  Problem Relation Age of Onset   Stroke Father    Hypothyroidism Mother        also dementia   Diabetes Mother    Depression Sister    Diabetes Brother    Schizophrenia Brother    Kidney disease Brother    Diabetes Maternal Grandmother    Dementia Maternal Grandfather    Heart attack Paternal Grandmother    Lung cancer Paternal Grandfather      Atrial Fibrillation Management history:  Previous antiarrhythmic drugs: none Previous cardioversions: none Previous ablations: none CHADS2VASC score: 0 Anticoagulation history: none   Past Medical History:  Diagnosis Date    Basal cell carcinoma of skin    Left groin hernia    actually improved with exercise   Past Surgical History:  Procedure Laterality Date   BASAL CELL CARCINOMA EXCISION      Current Outpatient Medications  Medication Sig Dispense Refill   busPIRone (BUSPAR) 5 MG tablet Take 1 tablet (5 mg total) by mouth 2 (two) times daily as needed (anxiety with travel). 10 tablet 0   psyllium (METAMUCIL) 58.6 % powder Take 1 packet by mouth daily.     Red Yeast Rice Extract (RED YEAST RICE PO) Take 2 capsules by mouth in the morning and at bedtime.     metoprolol succinate (TOPROL XL) 25 MG 24 hr tablet Take one tablet by mouth at bedtime     No current facility-administered medications for this encounter.    No Known Allergies  Social History   Socioeconomic History   Marital status: Married    Spouse name: Not on file   Number of children: 1   Years of education: college   Highest education level: Not on file  Occupational History   Occupation: Development worker, international aid    Employer: [employer]  Tobacco Use   Smoking status: Never   Smokeless tobacco: Never  Substance and Sexual Activity   Alcohol use: Yes    Alcohol/week: 2.0 standard drinks    Types: 2 Standard drinks or equivalent per week    Comment: social mainly   Drug use: No   Sexual activity: Not on file  Other Topics Concern   Not on file  Social  History Narrative   Married with 32 year old daughter in 2019      [employer] Development worker, 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    Quaker background      Hobbies: exercising   Social Determinants of Radio broadcast assistant Strain: Not on file  Food Insecurity: Not on file  Transportation Needs: Not on file  Physical Activity: Not on file  Stress: Not on file  Social Connections: Not on file  Intimate Partner Violence: Not on file     ROS- All systems are reviewed and negative except as per the HPI above.  Physical Exam: Vitals:   03/12/21 1105  BP: (!)  124/94  Pulse: (!) 112  Weight: 80.8 kg  Height: 6' (1.829 m)    GEN- The patient is a well appearing male, alert and oriented x 3 today.   Head- normocephalic, atraumatic Eyes-  Sclera clear, conjunctiva pink Ears- hearing intact Oropharynx- clear Neck- supple  Lungs- Clear to ausculation bilaterally, normal work of breathing Heart- Regular rate and rhythm, tachycardia, no murmurs, rubs or gallops  GI- soft, NT, ND, + BS Extremities- no clubbing, cyanosis, or edema MS- no significant deformity or atrophy Skin- no rash or lesion Psych- euthymic mood, full affect Neuro- strength and sensation are intact  Wt Readings from Last 3 Encounters:  03/12/21 80.8 kg  07/14/20 80.5 kg  06/01/20 79.7 kg    EKG today demonstrates  Sinus tachycardia vs atrial tach Vent. rate 112 BPM PR interval 174 ms QRS duration 90 ms QT/QTcB 326/444 ms   Epic records are reviewed at length today  CHA2DS2-VASc Score = 0  The patient's score is based upon: CHF History: 0 HTN History: 0 Diabetes History: 0 Stroke History: 0 Vascular Disease History: 0 Age Score: 0 Gender Score: 0      ASSESSMENT AND PLAN: 1. Paroxysmal Atrial Fibrillation/atrial tach The patient's CHA2DS2-VASc score is 0, indicating a 0.2% annual risk of stroke.   General education about afib provided and questions answered. We also discussed his stroke risk and the risks and benefits of anticoagulation. No anticoagulation indicated at this time. Check echocardiogram  Increase Toprol to 50 mg daily We also discussed rhythm control options including flecainide, Multaq, or ablation. Patient would like to work on decreasing alcohol consumption and increasing his physical activity in addition to increasing metoprolol for now.    Follow up in the AF clinic in one month.    [facility] [address] [place], [place] [address] [phone] 03/12/2021 11:30 AM

## 2021-03-12 NOTE — Patient Instructions (Signed)
Increase metoprolol to 50mg once a day ° °

## 2021-03-23 ENCOUNTER — Ambulatory Visit: Payer: BC Managed Care – PPO | Admitting: Family Medicine

## 2021-03-23 VITALS — BP 116/86 | HR 104

## 2021-03-23 DIAGNOSIS — Z0130 Encounter for examination of blood pressure without abnormal findings: Secondary | ICD-10-CM

## 2021-03-23 NOTE — Progress Notes (Signed)
Subjective:     Patient ID: Casey Zavala, male   DOB: 1970-07-13, 50 y.o.   MRN: [id number]  HPI Casey Zavala presents to the employee health and wellness clinic today for a recheck of his BP. He has been increasing his exercise, getting up and walking in the mornings. He is doing well with healthy eating. He states after his last visit he had an episode of A. Fib and his cardiologist referred him to the a fib clinic. He was seen there and his metoprolol was increased to 50 mg daily and he has an echo scheduled and is considering cardiac ablation for treatment. He denies any other problems or concerns today.   Past Medical History:  Diagnosis Date   Basal cell carcinoma of skin    Left groin hernia    actually improved with exercise   No Known Allergies  Current Outpatient Medications:    busPIRone (BUSPAR) 5 MG tablet, Take 1 tablet (5 mg total) by mouth 2 (two) times daily as needed (anxiety with travel)., Disp: 10 tablet, Rfl: 0   metoprolol succinate (TOPROL XL) 50 MG 24 hr tablet, Take 1 tablet (50 mg total) by mouth daily., Disp: 30 tablet, Rfl: 3   psyllium (METAMUCIL) 58.6 % powder, Take 1 packet by mouth daily., Disp: , Rfl:    Red Yeast Rice Extract (RED YEAST RICE PO), Take 2 capsules by mouth in the morning and at bedtime., Disp: , Rfl:    Review of Systems  Constitutional:  Negative for chills, fatigue, fever and unexpected weight change.  HENT:  Negative for congestion, ear pain, sinus pressure, sinus pain and sore throat.   Eyes:  Negative for discharge and visual disturbance.  Respiratory:  Negative for cough, shortness of breath and wheezing.   Cardiovascular:  Negative for chest pain and leg swelling.  Gastrointestinal:  Negative for abdominal pain, blood in stool, constipation, diarrhea, nausea and vomiting.  Genitourinary:  Negative for difficulty urinating and hematuria.  Skin:  Negative for color change.  Neurological:  Negative for dizziness, weakness,  light-headedness and headaches.  Hematological:  Negative for adenopathy.  All other systems reviewed and are negative.     Objective:   Physical Exam Vitals reviewed.  Constitutional:      General: He is not in acute distress.    Appearance: Normal appearance. He is well-developed.  HENT:     Head: Normocephalic and atraumatic.  Eyes:     General:        Right eye: No discharge.        Left eye: No discharge.  Cardiovascular:     Rate and Rhythm: Tachycardia present.  Pulmonary:     Effort: Pulmonary effort is normal.  Musculoskeletal:     Cervical back: Neck supple.  Skin:    General: Skin is warm and dry.  Neurological:     Mental Status: He is alert and oriented to person, place, and time.  Psychiatric:        Mood and Affect: Mood normal.        Behavior: Behavior normal.  Today's Vitals   03/23/21 1041  BP: 116/86  Pulse: (!) 104  SpO2: 98%   There is no height or weight on file to calculate BMI.      Assessment:     Blood pressure check     Plan:     BP normal today. Continue efforts at exercise and healthy eating. Keep all f/u appts with specialists and PCP. Discussed risks  and benefits of cardiac ablation treatment and pt info given from up to date for him to read more on this treatment option. Encouraged him to discuss further with his specialist. F/u here prn.

## 2021-03-29 ENCOUNTER — Other Ambulatory Visit: Payer: Self-pay

## 2021-03-29 ENCOUNTER — Ambulatory Visit (AMBULATORY_SURGERY_CENTER): Payer: BC Managed Care – PPO | Admitting: *Deleted

## 2021-03-29 VITALS — Ht 72.0 in | Wt 176.0 lb

## 2021-03-29 DIAGNOSIS — Z1211 Encounter for screening for malignant neoplasm of colon: Secondary | ICD-10-CM

## 2021-03-29 MED ORDER — PEG-KCL-NACL-NASULF-NA ASC-C 100 G PO SOLR: 1.0000 | Freq: Once | ORAL | 0 refills | Status: AC

## 2021-03-29 NOTE — Progress Notes (Signed)
Pt verified name, DOB, address and insurance during PV today.  Pt mailed instruction packet of Emmi video, copy of consent form to read and not return, and instructions . PV completed over the phone.  Pt encouraged to call with questions or issues.  My Chart instructions to pt as well    No egg or soy allergy known to patient  No issues known to pt with past sedation with any surgeries or procedures Patient denies ever being told they had issues or difficulty with intubation  No FH of Malignant Hyperthermia Pt is not on diet pills Pt is not on  home 02  Pt is not on blood thinners  Pt denies issues with constipation   A fib- no BT - has Echo 10-25 pending- pt aware if further testing needed, he may have to RS the 10-28 Colon - pt verbalized understanding - pt had a stress test 06-18-2020  PAC's, negative stress test - pt states he is having no issues   Pt is fully vaccinated  for Covid   Due to the COVID-19 pandemic we are asking patients to follow certain guidelines in PV and the [facility]   Pt aware of COVID protocols and [facility] guidelines

## 2021-04-01 ENCOUNTER — Encounter: Payer: Self-pay | Admitting: Gastroenterology

## 2021-04-05 ENCOUNTER — Other Ambulatory Visit (HOSPITAL_COMMUNITY): Payer: BC Managed Care – PPO

## 2021-04-07 ENCOUNTER — Ambulatory Visit (HOSPITAL_COMMUNITY): Payer: BC Managed Care – PPO | Admitting: Physician Assistant

## 2021-04-09 ENCOUNTER — Encounter: Payer: BC Managed Care – PPO | Admitting: Gastroenterology

## 2021-04-13 ENCOUNTER — Encounter: Payer: Self-pay | Admitting: Family Medicine

## 2021-04-13 ENCOUNTER — Ambulatory Visit (HOSPITAL_COMMUNITY)
Admission: RE | Admit: 2021-04-13 | Discharge: 2021-04-13 | Disposition: A | Discharge status: Home / Self Care | Payer: BC Managed Care – PPO | Source: Ambulatory Visit | Attending: Physician Assistant | Admitting: Physician Assistant

## 2021-04-13 ENCOUNTER — Other Ambulatory Visit: Payer: Self-pay

## 2021-04-13 DIAGNOSIS — I480 Paroxysmal atrial fibrillation: Secondary | ICD-10-CM | POA: Insufficient documentation

## 2021-04-13 DIAGNOSIS — E785 Hyperlipidemia, unspecified: Secondary | ICD-10-CM | POA: Insufficient documentation

## 2021-04-13 LAB — ECHOCARDIOGRAM COMPLETE
Area-P 1/2: 5.13 cm2
Calc EF: 49 %
S' Lateral: 3.3 cm
Single Plane A2C EF: 52.5 %
Single Plane A4C EF: 47.6 %

## 2021-04-13 NOTE — Progress Notes (Signed)
  Echocardiogram 2D Echocardiogram has been performed.  Casey Zavala 04/13/2021, 4:10 PM

## 2021-04-15 ENCOUNTER — Ambulatory Visit (HOSPITAL_COMMUNITY)
Admission: RE | Admit: 2021-04-15 | Discharge: 2021-04-15 | Disposition: A | Discharge status: Home / Self Care | Payer: BC Managed Care – PPO | Source: Ambulatory Visit | Attending: Physician Assistant | Admitting: Physician Assistant

## 2021-04-15 ENCOUNTER — Encounter (HOSPITAL_COMMUNITY): Payer: Self-pay | Admitting: Physician Assistant

## 2021-04-15 ENCOUNTER — Other Ambulatory Visit: Payer: Self-pay

## 2021-04-15 VITALS — BP 128/94 | HR 106 | Ht 72.0 in | Wt 176.2 lb

## 2021-04-15 DIAGNOSIS — I480 Paroxysmal atrial fibrillation: Secondary | ICD-10-CM | POA: Diagnosis not present

## 2021-04-15 MED ORDER — BUSPIRONE HCL 5 MG PO TABS: 5.0000 mg | ORAL_TABLET | Freq: Two times a day (BID) | ORAL | 0 refills | Status: DC | PRN

## 2021-04-15 NOTE — Progress Notes (Signed)
Primary Care Physician: Marin Olp, MD Primary Cardiologist: Dr Debara Pickett Primary Electrophysiologist: none Referring Physician: Dr Vicente Males is a 50 y.o. male with a history of atrial fibrillation, atrial tachycardia who presents for follow up in the [facility].  The patient was initially diagnosed with atrial fibrillation 04/2020 on his smart watch. Patient has a CHADS2VASC score of 0. He has noticed more frequent irregular rhythm and heart racing despite increasing metoprolol, 5-6 episodes. His longest episode lasted 8 hours. When in afib he "feels off" and does have palpitations. See strips in phone note 03/05/21.   On follow up today, patient has done well since his last visit. He had an echo which showed EF 45-50%, no valvular issues. He has not noticed any change in his heart rate since increasing metoprolol.   Today, he denies symptoms of chest pain, shortness of breath, orthopnea, PND, lower extremity edema, dizziness, presyncope, syncope, snoring, daytime somnolence, bleeding, or neurologic sequela. The patient is tolerating medications without difficulties and is otherwise without complaint today.    Atrial Fibrillation Risk Factors:  he does not have symptoms or diagnosis of sleep apnea. he does not have a history of rheumatic fever. he does have a history of alcohol use. The patient does have a history of early familial atrial fibrillation or other arrhythmias. Father has afib.  he has a BMI of Body mass index is 23.9 kg/m.Marland Kitchen Filed Weights   04/15/21 1531  Weight: 79.9 kg     Family History  Problem Relation Age of Onset   Hypothyroidism Mother        also dementia   Diabetes Mother    Stroke Father    Depression Sister    Diabetes Brother    Schizophrenia Brother    Kidney disease Brother    Diabetes Maternal Grandmother    Dementia Maternal Grandfather    Heart attack Paternal Grandmother    Lung cancer  Paternal Grandfather    Colon cancer Neg Hx    Colon polyps Neg Hx    Esophageal cancer Neg Hx    Rectal cancer Neg Hx    Stomach cancer Neg Hx      Atrial Fibrillation Management history:  Previous antiarrhythmic drugs: none Previous cardioversions: none Previous ablations: none CHADS2VASC score: 0 Anticoagulation history: none   Past Medical History:  Diagnosis Date   Allergy    Atrial fibrillation ([place])    Basal cell carcinoma of skin    Hyperlipidemia    no meds   Left groin hernia    actually improved with exercise   Past Surgical History:  Procedure Laterality Date   BASAL CELL CARCINOMA EXCISION     WISDOM TOOTH EXTRACTION      Current Outpatient Medications  Medication Sig Dispense Refill   busPIRone (BUSPAR) 5 MG tablet Take 1 tablet (5 mg total) by mouth 2 (two) times daily as needed (anxiety with travel). 10 tablet 0   metoprolol succinate (TOPROL XL) 50 MG 24 hr tablet Take 1 tablet (50 mg total) by mouth daily. 30 tablet 3   psyllium (METAMUCIL) 58.6 % powder Take 1 packet by mouth daily.     Red Yeast Rice Extract (RED YEAST RICE PO) Take 2 capsules by mouth in the morning and at bedtime.     No current facility-administered medications for this encounter.    No Known Allergies  Social History   Socioeconomic History   Marital status: Married  Spouse name: Not on file   Number of children: 1   Years of education: college   Highest education level: Not on file  Occupational History   Occupation: Development worker, international aid    Employer: [employer]  Tobacco Use   Smoking status: Never   Smokeless tobacco: Never  Substance and Sexual Activity   Alcohol use: Yes    Alcohol/week: 2.0 standard drinks    Types: 1 Glasses of wine, 1 Cans of beer per week    Comment: social mainly   Drug use: No   Sexual activity: Not on file  Other Topics Concern   Not on file  Social History Narrative   Married with 59 year old daughter in 2019      [employer] Development worker, 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    Quaker background      Hobbies: exercising   Social Determinants of Radio broadcast assistant Strain: Not on file  Food Insecurity: Not on file  Transportation Needs: Not on file  Physical Activity: Not on file  Stress: Not on file  Social Connections: Not on file  Intimate Partner Violence: Not on file     ROS- All systems are reviewed and negative except as per the HPI above.  Physical Exam: Vitals:   04/15/21 1531  BP: (!) 128/94  Pulse: (!) 106  Weight: 79.9 kg  Height: 6' (1.829 m)    GEN- The patient is a well appearing male, alert and oriented x 3 today.   HEENT-head normocephalic, atraumatic, sclera clear, conjunctiva pink, hearing intact, trachea midline. Lungs- Clear to ausculation bilaterally, normal work of breathing Heart- Regular rate and rhythm, no murmurs, rubs or gallops  GI- soft, NT, ND, + BS Extremities- no clubbing, cyanosis, or edema MS- no significant deformity or atrophy Skin- no rash or lesion Psych- euthymic mood, full affect Neuro- strength and sensation are intact   Wt Readings from Last 3 Encounters:  04/15/21 79.9 kg  03/29/21 79.8 kg  03/12/21 80.8 kg    EKG today demonstrates  Atrial tachycardia vs sinus tach Vent. rate 106 BPM PR interval 164 ms QRS duration 86 ms QT/QTcB 336/446 ms  Echo 04/13/21 demonstrated   1. Left ventricular ejection fraction, by estimation, is 45 to 50%. The  left ventricle has mildly decreased function. The left ventricle has no  regional wall motion abnormalities. Left ventricular diastolic parameters are indeterminate.   2. Right ventricular systolic function is normal. The right ventricular  size is normal. Tricuspid regurgitation signal is inadequate for assessing PA pressure.   3. The mitral valve is normal in structure. No evidence of mitral valve  regurgitation. No evidence of mitral stenosis.   4. The aortic valve has an  indeterminant number of cusps. Aortic valve regurgitation is not visualized. No aortic stenosis is present.   5. There is borderline dilatation of the ascending aorta, measuring 37  mm.   6. The inferior vena cava is normal in size with greater than 50%  respiratory variability, suggesting right atrial pressure of 3 mmHg.    Epic records are reviewed at length today  CHA2DS2-VASc Score = 0  The patient's score is based upon: CHF History: 0 HTN History: 0 Diabetes History: 0 Stroke History: 0 Vascular Disease History: 0 Age Score: 0 Gender Score: 0      ASSESSMENT AND PLAN: 1. Paroxysmal Atrial Fibrillation/atrial tach The patient's CHA2DS2-VASc score is 0, indicating a 0.2% annual risk of stroke.  Patient reports that prior to 2016, his heart rate was always in the 50-60 bpm and since then it has been ~ 100 bpm. Heart rate has not improved on higher dose of BB, will refer to EP. ? If he would be a candidate for EP study/ablation given mildly reduced EF. No anticoagulation indicated at this time. Continue Toprol 50 mg daily   Follow up with EP.    [facility] [address] [place], [place] [address] [phone] 04/15/2021 3:36 PM

## 2021-04-16 ENCOUNTER — Encounter: Payer: Self-pay | Admitting: Gastroenterology

## 2021-04-16 ENCOUNTER — Ambulatory Visit (AMBULATORY_SURGERY_CENTER): Payer: BC Managed Care – PPO | Admitting: Gastroenterology

## 2021-04-16 VITALS — BP 107/75 | HR 88 | Temp 99.3°F | Resp 16 | Ht 72.0 in | Wt 176.0 lb

## 2021-04-16 DIAGNOSIS — Z1211 Encounter for screening for malignant neoplasm of colon: Secondary | ICD-10-CM

## 2021-04-16 MED ORDER — SODIUM CHLORIDE 0.9 % IV SOLN
500.0000 mL | Freq: Once | INTRAVENOUS | Status: DC
Start: 2021-04-16 — End: 2021-06-03

## 2021-04-16 NOTE — Op Note (Signed)
[place] Patient Name: Casey Zavala Procedure Date: 04/16/2021 1:47 PM MRN: [id number] Endoscopist: Milus Banister , MD Age: 50 Referring MD:  Date of Birth: [birth year] Gender: Male Account #: [id number] Procedure:                Colonoscopy Indications:              Screening for colorectal malignant neoplasm Medicines:                Monitored Anesthesia Care Procedure:                Pre-Anesthesia Assessment:                           - Prior to the procedure, a History and Physical                            was performed, and patient medications and                            allergies were reviewed. The patient's tolerance of                            previous anesthesia was also reviewed. The risks                            and benefits of the procedure and the sedation                            options and risks were discussed with the patient.                            All questions were answered, and informed consent                            was obtained. Prior Anticoagulants: The patient has                            taken no previous anticoagulant or antiplatelet                            agents. ASA Grade Assessment: II - A patient with                            mild systemic disease. After reviewing the risks                            and benefits, the patient was deemed in                            satisfactory condition to undergo the procedure.                           After obtaining informed consent, the colonoscope  was passed under direct vision. Throughout the                            procedure, the patient's blood pressure, pulse, and                            oxygen saturations were monitored continuously. The                            CF [id number] #[id number] was introduced through the anus                            and advanced to the the cecum, identified by                            appendiceal  orifice and ileocecal valve. The                            colonoscopy was performed without difficulty. The                            patient tolerated the procedure well. The quality                            of the bowel preparation was good. The ileocecal                            valve, appendiceal orifice, and rectum were                            photographed. Scope In: 1:51:17 PM Scope Out: 2:05:38 PM Scope Withdrawal Time: 0 hours 10 minutes 50 seconds  Total Procedure Duration: 0 hours 14 minutes 21 seconds  Findings:                 The entire examined colon appeared normal. Complications:            No immediate complications. Estimated blood loss:                            None. Estimated Blood Loss:     Estimated blood loss: none. Impression:               - The entire examined colon is normal.                           - No polyps or cancers. Recommendation:           - Patient has a contact number available for                            emergencies. The signs and symptoms of potential                            delayed complications were discussed with the  patient. Return to normal activities tomorrow.                            Written discharge instructions were provided to the                            patient.                           - Resume previous diet.                           - Continue present medications.                           - Repeat colonoscopy in 10 years for screening. Milus Banister, MD 04/16/2021 2:07:06 PM This report has been signed electronically.

## 2021-04-16 NOTE — Progress Notes (Signed)
A and O x3. Report to RN. Tolerated MAC anesthesia well. 

## 2021-04-16 NOTE — Patient Instructions (Signed)
YOU HAD AN ENDOSCOPIC PROCEDURE TODAY AT [facility]:   Refer to the procedure report that was given to you for any specific questions about what was found during the examination.  If the procedure report does not answer your questions, please call your gastroenterologist to clarify.  If you requested that your care partner not be given the details of your procedure findings, then the procedure report has been included in a sealed envelope for you to review at your convenience later.  YOU SHOULD EXPECT: Some feelings of bloating in the abdomen. Passage of more gas than usual.  Walking can help get rid of the air that was put into your GI tract during the procedure and reduce the bloating. If you had a lower endoscopy (such as a colonoscopy or flexible sigmoidoscopy) you may notice spotting of blood in your stool or on the toilet paper. If you underwent a bowel prep for your procedure, you may not have a normal bowel movement for a few days.  Please Note:  You might notice some irritation and congestion in your nose or some drainage.  This is from the oxygen used during your procedure.  There is no need for concern and it should clear up in a day or so.  SYMPTOMS TO REPORT IMMEDIATELY:  Following lower endoscopy (colonoscopy or flexible sigmoidoscopy):  Excessive amounts of blood in the stool  Significant tenderness or worsening of abdominal pains  Swelling of the abdomen that is new, acute  Fever of 100F or higher   For urgent or emergent issues, a gastroenterologist can be reached at any hour by calling [phone]. Do not use MyChart messaging for urgent concerns.    DIET:  We do recommend a small meal at first, but then you may proceed to your regular diet.  Drink plenty of fluids but you should avoid alcoholic beverages for 24 hours.  MEDICATIONS: Continue present medications.  Thank you for allowing [place] to provide for your healthcare needs today.  ACTIVITY:  You  should plan to take it easy for the rest of today and you should NOT DRIVE or use heavy machinery until tomorrow (because of the sedation medicines used during the test).    FOLLOW UP: Our staff will call the number listed on your records 48-72 hours following your procedure to check on you and address any questions or concerns that you may have regarding the information given to you following your procedure. If we do not reach you, we will leave a message.  We will attempt to reach you two times.  During this call, we will ask if you have developed any symptoms of COVID 19. If you develop any symptoms (ie: fever, flu-like symptoms, shortness of breath, cough etc.) before then, please call [phone].  If you test positive for Covid 19 in the 2 weeks post procedure, please call and report this information to [place].    If any biopsies were taken you will be contacted by phone or by letter within the next 1-3 weeks.  Please call us at [phone] if you have not heard about the biopsies in 3 weeks.    SIGNATURES/CONFIDENTIALITY: You and/or your care partner have signed paperwork which will be entered into your electronic medical record.  These signatures attest to the fact that that the information above on your After Visit Summary has been reviewed and is understood.  Full responsibility of the confidentiality of this discharge information lies with you and/or your care-partner.

## 2021-04-16 NOTE — Progress Notes (Signed)
HPI: This is a man at routine risk for CRC   ROS: complete GI ROS as described in HPI, all other review negative.  Constitutional:  No unintentional weight loss   Past Medical History:  Diagnosis Date   Allergy    Atrial fibrillation ([place])    Basal cell carcinoma of skin    Hyperlipidemia    no meds   Left groin hernia    actually improved with exercise    Past Surgical History:  Procedure Laterality Date   BASAL CELL CARCINOMA EXCISION     WISDOM TOOTH EXTRACTION      Current Outpatient Medications  Medication Sig Dispense Refill   metoprolol succinate (TOPROL XL) 50 MG 24 hr tablet Take 1 tablet (50 mg total) by mouth daily. 30 tablet 3   psyllium (METAMUCIL) 58.6 % powder Take 1 packet by mouth daily.     Red Yeast Rice Extract (RED YEAST RICE PO) Take 2 capsules by mouth in the morning and at bedtime.     busPIRone (BUSPAR) 5 MG tablet Take 1 tablet (5 mg total) by mouth 2 (two) times daily as needed (anxiety with travel). 10 tablet 0   Current Facility-Administered Medications  Medication Dose Route Frequency Provider Last Rate Last Admin   0.9 %  sodium chloride infusion  500 mL Intravenous Once Milus Banister, MD        Allergies as of 04/16/2021   (No Known Allergies)    Family History  Problem Relation Age of Onset   Hypothyroidism Mother        also dementia   Diabetes Mother    Stroke Father    Depression Sister    Diabetes Brother    Schizophrenia Brother    Kidney disease Brother    Diabetes Maternal Grandmother    Dementia Maternal Grandfather    Heart attack Paternal Grandmother    Lung cancer Paternal Grandfather    Colon cancer Neg Hx    Colon polyps Neg Hx    Esophageal cancer Neg Hx    Rectal cancer Neg Hx    Stomach cancer Neg Hx     Social History   Socioeconomic History   Marital status: Married    Spouse name: Not on file   Number of children: 1   Years of education: college   Highest education level: Not on file   Occupational History   Occupation: Development worker, international aid    Employer: [employer]  Tobacco Use   Smoking status: Never   Smokeless tobacco: Never  Substance and Sexual Activity   Alcohol use: Yes    Alcohol/week: 2.0 standard drinks    Types: 1 Glasses of wine, 1 Cans of beer per week    Comment: social mainly   Drug use: No   Sexual activity: Not on file  Other Topics Concern   Not on file  Social History Narrative   Married with 13 year old daughter in 2019      [employer] Development worker, 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    Qu[employer] background      Hobbies: exercising   Social Determinants of Radio broadcast assistant Strain: Not on file  Food Insecurity: Not on file  Transportation Needs: Not on file  Physical Activity: Not on file  Stress: Not on file  Social Connections: Not on file  Intimate Partner Violence: Not on file     Physical Exam: BP 122/80   Pulse (!) 112  Temp 99.3 F (37.4 C)   Ht 6' (1.829 m)   Wt 176 lb (79.8 kg)   SpO2 100%   BMI 23.87 kg/m  Constitutional: generally well-appearing Psychiatric: alert and oriented x3 Lungs: CTA bilaterally Heart: no MCR  Assessment and plan: 50 y.o. male with routine risk for CRC  Colonoscopy today  Care is appropriate for the ambulatory setting.  Owens Loffler, MD [facility] 04/16/2021, 1:44 PM

## 2021-04-20 ENCOUNTER — Telehealth: Payer: Self-pay

## 2021-04-20 NOTE — Telephone Encounter (Signed)
Attempted to reach pt. With follow-up call following endoscopic procedure 04/16/2021.  LM on pt. Voice mail to call if you have any questions or concerns.

## 2021-05-18 ENCOUNTER — Encounter: Payer: Self-pay | Admitting: Cardiology

## 2021-05-18 ENCOUNTER — Other Ambulatory Visit: Payer: Self-pay

## 2021-05-18 ENCOUNTER — Ambulatory Visit: Payer: BC Managed Care – PPO | Admitting: Cardiology

## 2021-05-18 VITALS — BP 122/90 | HR 111 | Ht 72.0 in | Wt 180.0 lb

## 2021-05-18 DIAGNOSIS — Z01812 Encounter for preprocedural laboratory examination: Secondary | ICD-10-CM

## 2021-05-18 DIAGNOSIS — I480 Paroxysmal atrial fibrillation: Secondary | ICD-10-CM | POA: Diagnosis not present

## 2021-05-18 DIAGNOSIS — Z01818 Encounter for other preprocedural examination: Secondary | ICD-10-CM

## 2021-05-18 MED ORDER — APIXABAN 5 MG PO TABS: 5.0000 mg | ORAL_TABLET | Freq: Two times a day (BID) | ORAL | 6 refills | Status: DC

## 2021-05-18 NOTE — Patient Instructions (Addendum)
Medication Instructions:  Your physician has recommended you make the following change in your medication:  START Eliquis 5 mg twice daily --- you will start this on 06/28/2021  *If you need a refill on your cardiac medications before your next appointment, please call your pharmacy*   Lab Work: Pre procedure labs 06/28/2021:  BMP & CBC  You do NOT need to be fasting. You can stop by the [employer] office anytime that day between 7:30 am - 4:30 pm  If you have labs (blood work) drawn today and your tests are completely normal, you will receive your results only by: [place] (if you have MyChart) OR A paper copy in the mail If you have any lab test that is abnormal or we need to change your treatment, we will call you to review the results.   Testing/Procedures: Your physician has requested that you have cardiac CT within 7 days PRIOR to your ablation. Cardiac computed tomography (CT) is a painless test that uses an x-ray machine to take clear, detailed pictures of your heart.  Please follow instruction below located under "other instructions". You will get a call from our office to schedule the date for this test.  Your physician has recommended that you have an ablation. Catheter ablation is a medical procedure used to treat some cardiac arrhythmias (irregular heartbeats). During catheter ablation, a long, thin, flexible tube is put into a blood vessel in your groin (upper thigh), or neck. This tube is called an ablation catheter. It is then guided to your heart through the blood vessel. Radio frequency waves destroy small areas of heart tissue where abnormal heartbeats may cause an arrhythmia to start. Please follow instruction below located under "other instructions".   Follow-Up: At [facility], you and your health needs are our priority.  As part of our continuing mission to provide you with exceptional heart care, we have created designated Provider Care Teams.  These Care  Teams include your primary Cardiologist (physician) and Advanced Practice Providers (APPs -  Physician Assistants and Nurse Practitioners) who all work together to provide you with the care you need, when you need it.  Your next appointment:   1 month(s) after your ablation  The format for your next appointment:   In Person  Provider:   AFib clinic   Thank you for choosing [facility]!!   Trinidad Curet, RN [phone]    Other Instructions   CT INSTRUCTIONS Your cardiac CT will be scheduled at:  [facility] [address] [place], [place] [address] [phone]  Please arrive at the [facility] main entrance of [facility] 30 minutes prior to test start time. Proceed to the [facility] Radiology Department (first floor) to check-in and test prep.   Please follow these instructions carefully (unless otherwise directed):  Hold all erectile dysfunction medications at least 3 days (72 hrs) prior to test.  On the Night Before the Test: Be sure to Drink plenty of water. Do not consume any caffeinated/decaffeinated beverages or chocolate 12 hours prior to your test. Do not take any antihistamines 12 hours prior to your test. On the Day of the Test: Drink plenty of water until 1 hour prior to the test. Do not eat any food 4 hours prior to the test. You may take your regular medications prior to the test.  Take Toprol 100 mg the night prior to this test. HOLD Furosemide/Hydrochlorothiazide morning of the test.      After  the Test: Drink plenty of water. After receiving IV contrast, you may experience a mild flushed feeling. This is normal. On occasion, you may experience a mild rash up to 24 hours after the test. This is not dangerous. If this occurs, you can take Benadryl 25 mg and increase your fluid intake. If you experience trouble breathing, this can be serious. If it is severe call 911 IMMEDIATELY. If it is mild, please call our office. If  you take any of these medications: Glipizide/Metformin, Avandament, Glucavance, please do not take 48 hours after completing test unless otherwise instructed.   Once we have confirmed authorization from your insurance company, we will call you to set up a date and time for your test. Based on how quickly your insurance processes prior authorizations requests, please allow up to 4 weeks to be contacted for scheduling your Cardiac CT appointment. Be advised that routine Cardiac CT appointments could be scheduled as many as 8 weeks after your provider has ordered it.  For non-scheduling related questions, please contact the cardiac imaging nurse navigator should you have any questions/concerns: Marchia Bond, Cardiac Imaging Nurse Navigator Gordy Clement, Cardiac Imaging Nurse Navigator  [facility] V[facility] Services Direct Office Dial: [phone]   For scheduling needs, including cancellations and rescheduling, please call [place], [phone].      Electrophysiology/Ablation Procedure Instructions   You are scheduled for a(n)  ablation on 07/21/2021 with Dr. Allegra Lai.   1.   Pre procedure testing-             A.  LAB WORK --- On 06/28/2021 for your pre procedure blood work. You do NOT need to be fasting. You can stop by the [employer] office anytime that day between 7:30 am - 4:30 pm   On the day of your procedure 07/21/2021 you will go to [facility] hospital ([address]) at 9:30 am.  Dennis Bast will go to the main entrance A [employer]) and enter where the [employer] are.  Your driver will drop you off and you will head down the hallway to ADMITTING.  You may have one support person come in to the hospital with you.  They will be asked to wait in the waiting room. It is OK to have someone drop you off and come back when you are ready to be discharged.   3.   Do not eat or drink after midnight prior to your procedure.   4.   On the morning of your procedure do  NOT take any medication. Do not miss any doses of your blood thinner prior to the morning of your procedure or your procedure will need to be rescheduled.   5.  Plan for an overnight stay but you may be discharged after your procedure, if you use your phone frequently bring your phone charger. If you are discharged after your procedure you will need someone to drive you home and be with you for 24 hours after your procedure.   6. You will follow up with the AFIB clinic 4 weeks after your procedure.  You will follow up with Dr. Curt Bears  3 months after your procedure.  These appointments will be made for you.   7. FYI: For your safety, and to allow [place] to monitor your vital signs accurately during the surgery/procedure we request that if you have artificial nails, gel coating, SNS etc. Please have those removed prior to your surgery/procedure. Not having the nail coverings /polish removed may  result in cancellation or delay of your surgery/procedure.  * If you have ANY questions please call the office [phone] and ask for Afreen Siebels RN or send me a MyChart message   * Occasionally, EP Studies and ablations can become lengthy.  Please make your family aware of this before your procedure starts.  Average time ranges from 2-8 hours for EP studies/ablations.  Your physician will call your family after the procedure with the results.                                  Cardiac Ablation Cardiac ablation is a procedure to destroy (ablate) some heart tissue that is sending bad signals. These bad signals cause problems in heart rhythm. The heart has many areas that make these signals. If there are problems in these areas, they can make the heart beat in a way that is not normal. Destroying some tissues can help make the heart rhythm normal. Tell your doctor about: Any allergies you have. All medicines you are taking. These include vitamins, herbs, eye drops, creams, and over-the-counter medicines. Any problems  you or family members have had with medicines that make you fall asleep (anesthetics). Any blood disorders you have. Any surgeries you have had. Any medical conditions you have, such as kidney failure. Whether you are pregnant or may be pregnant. What are the risks? This is a safe procedure. But problems may occur, including: Infection. Bruising and bleeding. Bleeding into the chest. Stroke or blood clots. Damage to nearby areas of your body. Allergies to medicines or dyes. The need for a pacemaker if the normal system is damaged. Failure of the procedure to treat the problem. What happens before the procedure? Medicines Ask your doctor about: Changing or stopping your normal medicines. This is important. Taking aspirin and ibuprofen. Do not take these medicines unless your doctor tells you to take them. Taking other medicines, vitamins, herbs, and supplements. General instructions Follow instructions from your doctor about what you cannot eat or drink. Plan to have someone take you home from the hospital or clinic. If you will be going home right after the procedure, plan to have someone with you for 24 hours. Ask your doctor what steps will be taken to prevent infection. What happens during the procedure?  An IV tube will be put into one of your veins. You will be given a medicine to help you relax. The skin on your neck or groin will be numbed. A cut (incision) will be made in your neck or groin. A needle will be put through your cut and into a large vein. A tube (catheter) will be put into the needle. The tube will be moved to your heart. Dye may be put through the tube. This helps your doctor see your heart. Small devices (electrodes) on the tube will send out signals. A type of energy will be used to destroy some heart tissue. The tube will be taken out. Pressure will be held on your cut. This helps stop bleeding. A bandage will be put over your cut. The exact procedure  may vary among doctors and hospitals. What happens after the procedure? You will be watched until you leave the hospital or clinic. This includes checking your heart rate, breathing rate, oxygen, and blood pressure. Your cut will be watched for bleeding. You will need to lie still for a few hours. Do not drive for 24 hours or  as long as your doctor tells you. Summary Cardiac ablation is a procedure to destroy some heart tissue. This is done to treat heart rhythm problems. Tell your doctor about any medical conditions you may have. Tell him or her about all medicines you are taking to treat them. This is a safe procedure. But problems may occur. These include infection, bruising, bleeding, and damage to nearby areas of your body. Follow what your doctor tells you about food and drink. You may also be told to change or stop some of your medicines. After the procedure, do not drive for 24 hours or as long as your doctor tells you. This information is not intended to replace advice given to you by your health care provider. Make sure you discuss any questions you have with your health care provider. Document Revised: 05/09/2019 Document Reviewed: 05/09/2019 Elsevier Patient Education  2022 [employer].

## 2021-05-18 NOTE — Progress Notes (Signed)
Electrophysiology Office Note   Date:  05/18/2021   ID:  Casey Zavala, DOB 05/17/1971, MRN [id number]  PCP:  Marin Olp, MD  Cardiologist:  Debara Pickett Primary Electrophysiologist:  Zaniel Marineau Meredith Leeds, MD    Chief Complaint: AF   History of Present Illness: Casey Zavala is a 50 y.o. male who is being seen today for the evaluation of AF at the request of Fenton, Clint R, PA. Presenting today for electrophysiology evaluation.  He has a history significant for atrial fibrillation and atrial tachycardia.  He was diagnosed with atrial fibrillation in November 2021 on his smart watch.  He noted more frequent irregular rhythms with palpitations despite higher doses of metoprolol.  His longest episode lasted 8 hours.  He feels weak and fatigued when he is in atrial fibrillation.  He is also noticed over the last few years that his heart rate has been rapid.  He was initially put on metoprolol, but this has been increased.  Prior to this, his heart rate was in the 50s to 60s.  He is unaware of his rapid heart rate, but is concerned as he is not used to his heart rates being in the 90s to low 100s.  Today, he denies symptoms of palpitations, chest pain, shortness of breath, orthopnea, PND, lower extremity edema, claudication, dizziness, presyncope, syncope, bleeding, or neurologic sequela. The patient is tolerating medications without difficulties.    Past Medical History:  Diagnosis Date   Allergy    Atrial fibrillation ([place])    Basal cell carcinoma of skin    Hyperlipidemia    no meds   Left groin hernia    actually improved with exercise   Past Surgical History:  Procedure Laterality Date   BASAL CELL CARCINOMA EXCISION     WISDOM TOOTH EXTRACTION       Current Outpatient Medications  Medication Sig Dispense Refill   [START ON 06/28/2021] apixaban (ELIQUIS) 5 MG TABS tablet Take 1 tablet (5 mg total) by mouth 2 (two) times daily. 60 tablet 6   busPIRone (BUSPAR) 5 MG  tablet Take 1 tablet (5 mg total) by mouth 2 (two) times daily as needed (anxiety with travel). 10 tablet 0   metoprolol succinate (TOPROL XL) 50 MG 24 hr tablet Take 1 tablet (50 mg total) by mouth daily. 30 tablet 3   psyllium (METAMUCIL) 58.6 % powder Take 1 packet by mouth daily.     Red Yeast Rice Extract (RED YEAST RICE PO) Take 2 capsules by mouth in the morning and at bedtime.     Current Facility-Administered Medications  Medication Dose Route Frequency Provider Last Rate Last Admin   0.9 %  sodium chloride infusion  500 mL Intravenous Once Milus Banister, MD        Allergies:   Patient has no known allergies.   Social History:  The patient  reports that he has never smoked. He has never used smokeless tobacco. He reports current alcohol use of about 2.0 standard drinks per week. He reports that he does not use drugs.   Family History:  The patient's family history includes Dementia in his maternal grandfather; Depression in his sister; Diabetes in his brother, maternal grandmother, and mother; Heart attack in his paternal grandmother; Hypothyroidism in his mother; Kidney disease in his brother; Lung cancer in his paternal grandfather; Schizophrenia in his brother; Stroke in his father.    ROS:  Please see the history of present illness.   Otherwise, review of  systems is positive for none.   All other systems are reviewed and negative.    PHYSICAL EXAM: VS:  BP 122/90   Pulse (!) 111   Ht 6' (1.829 m)   Wt 180 lb (81.6 kg)   SpO2 98%   BMI 24.41 kg/m  , BMI Body mass index is 24.41 kg/m. GEN: Well nourished, well developed, in no acute distress  HEENT: normal  Neck: no JVD, carotid bruits, or masses Cardiac: RRR; no murmurs, rubs, or gallops,no edema  Respiratory:  clear to auscultation bilaterally, normal work of breathing GI: soft, nontender, nondistended, + BS MS: no deformity or atrophy  Skin: warm and dry Neuro:  Strength and sensation are intact Psych: euthymic  mood, full affect  EKG:  EKG is ordered today. Personal review of the ekg ordered shows sinus tachycardia, rate 107  Recent Labs: 05/19/2020: ALT 20; TSH 1.290 06/01/2020: BUN 13; Creat 0.93; Hemoglobin 14.7; Platelets 318; Potassium 4.4; Sodium 140    Lipid Panel     Component Value Date/Time   CHOL 251 (H) 05/19/2020 0933   TRIG 84 05/19/2020 0933   HDL 74 05/19/2020 0933   CHOLHDL 3.4 05/19/2020 0933   CHOLHDL 4 05/27/2019 0849   VLDL 18.4 05/27/2019 0849   LDLCALC 163 (H) 05/19/2020 0933     Wt Readings from Last 3 Encounters:  05/18/21 180 lb (81.6 kg)  04/16/21 176 lb (79.8 kg)  04/15/21 176 lb 3.2 oz (79.9 kg)      Other studies Reviewed: Additional studies/ records that were reviewed today include: TTE 04/13/21  Review of the above records today demonstrates:   1. Left ventricular ejection fraction, by estimation, is 45 to 50%. The  left ventricle has mildly decreased function. The left ventricle has no  regional wall motion abnormalities. Left ventricular diastolic parameters  are indeterminate.   2. Right ventricular systolic function is normal. The right ventricular  size is normal. Tricuspid regurgitation signal is inadequate for assessing  PA pressure.   3. The mitral valve is normal in structure. No evidence of mitral valve  regurgitation. No evidence of mitral stenosis.   4. The aortic valve has an indeterminant number of cusps. Aortic valve  regurgitation is not visualized. No aortic stenosis is present.   5. There is borderline dilatation of the ascending aorta, measuring 37  mm.   6. The inferior vena cava is normal in size with greater than 50%  respiratory variability, suggesting right atrial pressure of 3 mmHg.    ASSESSMENT AND PLAN:  1.  Paroxysmal atrial fibrillation/atrial tachycardia: CHA2DS2-VASc of 0.  He has had more frequent episodes of atrial fibrillation.  He would like to avoid antiarrhythmic medications.  Due to that, we Mahum Betten plan  for atrial fibrillation ablation.  Risks and benefits were discussed.  He understands these risks and is agreed to the procedure.  If he does have an atrial tachycardia which continues throughout the procedure, we Valera Vallas likely plan to ablate that as well.  We Brixton Schnapp start Eliquis 3 weeks prior to his ablation procedure.  Risk, benefits, and alternatives to EP study and radiofrequency ablation for afib were also discussed in detail today. These risks include but are not limited to stroke, bleeding, vascular damage, tamponade, perforation, damage to the esophagus, lungs, and other structures, pulmonary vein stenosis, worsening renal function, and death. The patient understands these risk and wishes to proceed.  We Maleena Eddleman therefore proceed with catheter ablation at the next available time.  Carto, ICE, anesthesia  are requested for the procedure.  Coulter Oldaker also obtain CT PV protocol prior to the procedure to exclude LAA thrombus and further evaluate atrial anatomy.   Case discussed with primary cardiology  Current medicines are reviewed at length with the patient today.   The patient does not have concerns regarding his medicines.  The following changes were made today:  none  Labs/ tests ordered today include:  Orders Placed This Encounter  Procedures   CT CARDIAC MORPH/PULM VEIN W/CM&W/O CA SCORE   Basic metabolic panel   CBC   EKG 12-Lead     Disposition:   FU with [place] 3 months  Signed, Charlene Cowdrey Meredith Leeds, MD  05/18/2021 10:29 AM     [facility] [address] [place] [address] [phone] (office) [phone] (fax)

## 2021-06-01 NOTE — Progress Notes (Signed)
Phone: [phone]   Subjective:  Patient presents today for their annual physical. Chief complaint-noted.   See problem oriented charting- ROS- full  review of systems was completed and negative  except for: some significant work stress- working with EAP  (5 sessions planned), palpitations, decreased libido  The following were reviewed and entered/updated in epic: Past Medical History:  Diagnosis Date   Allergy    Atrial fibrillation ([place])    Basal cell carcinoma of skin    Hyperlipidemia    no meds   Left groin hernia    actually improved with exercise   Patient Active Problem List   Diagnosis Date Noted   Paroxysmal atrial fibrillation ([place]) 03/12/2021    Priority: High   History of skin cancer 10/31/2017    Priority: Low   Sinus node dysfunction ([place]) 09/06/2014    Priority: Low   Hyperlipidemia 09/06/2014    Priority: Low   Left groin hernia 01/17/2014    Priority: Low   Past Surgical History:  Procedure Laterality Date   BASAL CELL CARCINOMA EXCISION     WISDOM TOOTH EXTRACTION      Family History  Problem Relation Age of Onset   Hypothyroidism Mother        also dementia   Diabetes Mother    Stroke Father    Depression Sister    Diabetes Brother    Schizophrenia Brother    Kidney disease Brother    Diabetes Maternal Grandmother    Dementia Maternal Grandfather    Heart attack Paternal Grandmother    Lung cancer Paternal Grandfather    Colon cancer Neg Hx    Colon polyps Neg Hx    Esophageal cancer Neg Hx    Rectal cancer Neg Hx    Stomach cancer Neg Hx     Medications- reviewed and updated Current Outpatient Medications  Medication Sig Dispense Refill   [START ON 06/28/2021] apixaban (ELIQUIS) 5 MG TABS tablet Take 1 tablet (5 mg total) by mouth 2 (two) times daily. 60 tablet 6   busPIRone (BUSPAR) 5 MG tablet Take 1 tablet (5 mg total) by mouth 2 (two) times daily as needed (anxiety with travel). 10 tablet 0   metoprolol succinate (TOPROL XL)  50 MG 24 hr tablet Take 1 tablet (50 mg total) by mouth daily. 30 tablet 3   psyllium (METAMUCIL) 58.6 % powder Take 1 packet by mouth daily.     Red Yeast Rice Extract (RED YEAST RICE PO) Take 2 capsules by mouth in the morning and at bedtime.     No current facility-administered medications for this visit.    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    Quaker background      Hobbies: exercising   Objective  Objective:  BP 122/64 (BP Location: Left Arm, Patient Position: Sitting, Cuff Size: Normal)    Pulse 89    Temp 98.1 F (36.7 C) (Oral)    Ht 6' (1.829 m)    Wt 179 lb 3.2 oz (81.3 kg)    SpO2 99%    BMI 24.30 kg/m  Gen: NAD, resting comfortably HEENT: Mucous membranes are moist. Oropharynx normal Neck: no thyromegaly CV: RRR no murmurs rubs or gallops Lungs: CTAB no crackles, wheeze, rhonchi Abdomen: soft/nontender/nondistended/normal bowel sounds. No rebound or guarding.  Ext:  no edema Skin: warm, dry Neuro: grossly normal, moves all extremities, PERRLA    Assessment and Plan  50 y.o. male presenting for annual physical.  Health Maintenance counseling: 1. Anticipatory guidance: Patient counseled regarding regular dental exams -q6 months, eye exams -yearly,  avoiding smoking and second hand smoke , limiting alcohol to 2 beverages per day on average 1-2 every daily lately- some increased work stress,  no illicit drugs.   2. Risk factor reduction:  Advised patient of need for regular exercise and diet rich and fruits and vegetables to reduce risk of heart attack and stroke.  Exercise- not currently- not in last 3 years- has set up at home.  Diet/weight management- has worked with dietician in past. Wants to be between 172-175) -weight within 4 pounds of last year.  Wt Readings from Last 3 Encounters:   06/03/21 179 lb 3.2 oz (81.3 kg)  05/18/21 180 lb (81.6 kg)  04/16/21 176 lb (79.8 kg)  3. Immunizations/screenings/ancillary studies- discuss Pneumococcal vaccine-I do not feel strongly about this for atrial fibrillation alone, Shingrix-future year, Omicron/Bivalent booster-let us know when have bivalent booster, and Flu shot-already had - otherwise up-to-date. Immunization History  Administered Date(s) Administered   Influenza Split 05/31/2011   Influenza Whole 04/02/2008, 04/22/2009   Influenza,inj,Quad PF,6+ Mos 05/27/2019   Influenza-Unspecified 04/20/2017, 04/23/2018, 05/01/2020   Moderna Sars-Covid-2 Vaccination 06/24/2019, 07/22/2019, 05/22/2020   Td 06/20/1996, 02/09/2007   Tdap 05/17/2017  4. Prostate cancer screening- no family history, start at age 47.  63. Colon cancer screening - last colonoscopy 04/16/2021 with a 10-year repeat recommended 6. Skin cancer screening- follows with dermatology every 6 months due to history of skin cancer. Advised regular sunscreen use. Denies worrisome, changing, or new skin lesions- plus sees derm next week 7. Smoking associated screening (lung cancer screening, AAA screen 65-75, UA)- never smoker 8. STD screening - opts out as only active with wife  Status of chronic or acute concerns   #hyperlipidemia S: Medication:none at present  other than OTC red yeast rice in past- has come off that and metamucil- wants to restart metamucil Lab Results  Component Value Date   CHOL 251 (H) 05/19/2020   HDL 74 05/19/2020   LDLCALC 163 (H) 05/19/2020   TRIG 84 05/19/2020   CHOLHDL 3.4 05/19/2020   A/P: ASCVD risk of 2.9% based on last year's labs-update lipids but discussed if remains below 5% unlikely to start or consider statin statin or CT cardiac scoring- plus we will see results from ct cardiac morphology   #sparing anxiety- buspirone with flights/travel  # Sinus Node Dysfunction/Heart Palpitations # Atrial fibrillation discovered in  2021 S:Patient started seeing Dr. Debara Pickett in 2020 due to worsening palpitations. Was also having some dyspnea and fatigue but was not getting regular exercise and was under a lot of stress.  Had a negative treadmill stress test in 2016.  They opted to try as needed metoprolol.  Patient also had a visit with Dr. Rogers Blocker back in June 2021 and he had been experiencing mor palpitations - plan wa to have him start using metoprolol again and consider Zio patch. Ultimately patient saw Dr. Debara Pickett on May 19, 2020.  Patient reported throat tightness-TSH, T3, T4 were normal.  Ultimately he was found to have atrial fibrillation.  He has had worsening palpitations and is planned for upcoming ablation next year-he has a few test pending.  He will be on Eliquis for 3 weeks prior to ablation  Today, reports 3 episodes of a fib  in last week. Rate controlled with metoprolol 50 mg daily - increased from 25 mg back in September A/P:  scheduled in February for ablation- he is hopeful this will address his a fib and palpitations- was told there is a chance may be able to address the sinus node dysfunction as well.  -for now continue metoprolol. With chadsvasc of 0 plan is to remain off anticoagulant other than 3 weeks prior to ablatoin -Ef noted 45-50%- interested to see if changes after ablation  # Hyperglycemia- point-of-care A1c only at 5.4 in the past.  Family history of diabetes - we will trend fasting CBGs alone S:  Medication: none at present Exercise and diet- see discussion above Lab Results  Component Value Date   HGBA1C 5.4 05/27/2019  A/P: Update fasting CBG today  #Left groin hernia-evaluated by surgery in the past but was improving at that time.  No recurrent bulge in the groin- no issues whatsoever  #Low libido- has noted lower libido with work stress plus metoprolol going up- wants to check testosterone   Recommended follow up: No follow-ups on file. Future Appointments  Date Time Provider  [place]  06/28/2021 11:45 AM [facility]  07/15/2021  8:00 AM [facility] 2 [facility]  09/17/2021 10:00 AM Hilty, Nadean Corwin, MD [facility]   Lab/Order associations: fasting   ICD-10-CM   1. Preventative health care  Z00.00     2. Hyperlipidemia, unspecified hyperlipidemia type  E78.5     3. Sinus node dysfunction (HCC)  I49.5     4. Paroxysmal atrial fibrillation (HCC)  I48.0       No orders of the defined types were placed in this encounter.  I,Harris Phan,acting as a Education administrator for Garret Reddish, MD.,have documented all relevant documentation on the behalf of Garret Reddish, MD,as directed by  Garret Reddish, MD while in the presence of Garret Reddish, MD.    I, Garret Reddish, MD, have reviewed all documentation for this visit. The documentation on 06/03/21 for the exam, diagnosis, procedures, and orders are all accurate and complete.   Return precautions advised.  Garret Reddish, MD

## 2021-06-01 NOTE — Patient Instructions (Addendum)
Health Maintenance Due  Topic Date Due   Zoster Vaccines- Shingrix (1 of 2) - Please check with your pharmacy to see if they have the shingrix vaccine. If they do - you may get this immunization and update [place] by phone call or mychart with dates you receive the vaccine.  Never done   COVID-19 Vaccine (4 - Booster for Moderna series) - Recommend getting Omicron/Bivalent booster at your local pharmacy - we do not have this available in the office. Please let us know when you have received this vaccination.   07/17/2020   INFLUENZA VACCINE  - Team please log flu shot date.  01/18/2021   Strongly encourage that you make efforts in getting regular daily exercise into your lifestyle - goal is to get at least 150 minutes per week. I think this would be beneficial for your long term health.  I am glad to hear you will be doing the EAP at your job. Let us know if you need getting established with a therapist after EAP-  there several good options in town.  Also consider looking into F3 workout groups. [email or web address]  You are within 4 lbs compared to your weight since last year - keep up the good work!  Recommended follow up: Return in about 1 year (around 06/03/2022) for a physcial or sooner if needed.

## 2021-06-03 ENCOUNTER — Other Ambulatory Visit: Payer: Self-pay

## 2021-06-03 ENCOUNTER — Encounter: Payer: Self-pay | Admitting: Family Medicine

## 2021-06-03 ENCOUNTER — Ambulatory Visit (INDEPENDENT_AMBULATORY_CARE_PROVIDER_SITE_OTHER): Payer: BC Managed Care – PPO | Admitting: Family Medicine

## 2021-06-03 VITALS — BP 122/64 | HR 89 | Temp 98.1°F | Ht 72.0 in | Wt 179.2 lb

## 2021-06-03 DIAGNOSIS — E785 Hyperlipidemia, unspecified: Secondary | ICD-10-CM

## 2021-06-03 DIAGNOSIS — I480 Paroxysmal atrial fibrillation: Secondary | ICD-10-CM | POA: Diagnosis not present

## 2021-06-03 DIAGNOSIS — R6882 Decreased libido: Secondary | ICD-10-CM

## 2021-06-03 DIAGNOSIS — I495 Sick sinus syndrome: Secondary | ICD-10-CM

## 2021-06-03 DIAGNOSIS — Z0000 Encounter for general adult medical examination without abnormal findings: Secondary | ICD-10-CM | POA: Diagnosis not present

## 2021-06-03 LAB — COMPREHENSIVE METABOLIC PANEL
ALT: 13 U/L (ref 0–53)
AST: 17 U/L (ref 0–37)
Albumin: 4.7 g/dL (ref 3.5–5.2)
Alkaline Phosphatase: 42 U/L (ref 39–117)
BUN: 12 mg/dL (ref 6–23)
CO2: 31 mEq/L (ref 19–32)
Calcium: 9.8 mg/dL (ref 8.4–10.5)
Chloride: 102 mEq/L (ref 96–112)
Creatinine, Ser: 0.95 mg/dL (ref 0.40–1.50)
GFR: 93.22 mL/min (ref >60.00)
Glucose, Bld: 96 mg/dL (ref 70–99)
Potassium: 4.9 mEq/L (ref 3.5–5.1)
Sodium: 140 mEq/L (ref 135–145)
Total Bilirubin: 0.5 mg/dL (ref 0.2–1.2)
Total Protein: 7.6 g/dL (ref 6.0–8.3)

## 2021-06-03 LAB — CBC WITH DIFFERENTIAL/PLATELET
Basophils Absolute: 0 10*3/uL (ref 0.0–0.1)
Basophils Relative: 0.7 % (ref 0.0–3.0)
Eosinophils Absolute: 0.1 10*3/uL (ref 0.0–0.7)
Eosinophils Relative: 2 % (ref 0.0–5.0)
HCT: 43.2 % (ref 39.0–52.0)
Hemoglobin: 14.4 g/dL (ref 13.0–17.0)
Lymphocytes Relative: 35.1 % (ref 12.0–46.0)
Lymphs Abs: 1.3 10*3/uL (ref 0.7–4.0)
MCHC: 33.2 g/dL (ref 30.0–36.0)
MCV: 86.8 fl (ref 78.0–100.0)
Monocytes Absolute: 0.3 10*3/uL (ref 0.1–1.0)
Monocytes Relative: 8.3 % (ref 3.0–12.0)
Neutro Abs: 2 10*3/uL (ref 1.4–7.7)
Neutrophils Relative %: 53.9 % (ref 43.0–77.0)
Platelets: 320 10*3/uL (ref 150.0–400.0)
RBC: 4.98 Mil/uL (ref 4.22–5.81)
RDW: 13.1 % (ref 11.5–15.5)
WBC: 3.7 10*3/uL — ABNORMAL LOW (ref 4.0–10.5)

## 2021-06-03 LAB — LIPID PANEL
Cholesterol: 216 mg/dL — ABNORMAL HIGH (ref 0–200)
HDL: 70.2 mg/dL (ref >39.00)
LDL Cholesterol: 136 mg/dL — ABNORMAL HIGH (ref 0–99)
NonHDL: 145.4
Total CHOL/HDL Ratio: 3
Triglycerides: 49 mg/dL (ref 0.0–149.0)
VLDL: 9.8 mg/dL (ref 0.0–40.0)

## 2021-06-04 LAB — TESTOSTERONE TOTAL,FREE,BIO, MALES
Albumin: 4.6 g/dL (ref 3.6–5.1)
Sex Hormone Binding: 60 nmol/L — ABNORMAL HIGH (ref 10–50)
Testosterone, Bioavailable: 119.1 ng/dL (ref 110.0–575.0)
Testosterone, Free: 56.7 pg/mL (ref 46.0–224.0)
Testosterone: 688 ng/dL (ref 250–827)

## 2021-06-09 DIAGNOSIS — Z85828 Personal history of other malignant neoplasm of skin: Secondary | ICD-10-CM | POA: Diagnosis not present

## 2021-06-09 DIAGNOSIS — L718 Other rosacea: Secondary | ICD-10-CM | POA: Diagnosis not present

## 2021-06-09 DIAGNOSIS — D225 Melanocytic nevi of trunk: Secondary | ICD-10-CM | POA: Diagnosis not present

## 2021-06-09 DIAGNOSIS — L821 Other seborrheic keratosis: Secondary | ICD-10-CM | POA: Diagnosis not present

## 2021-06-09 DIAGNOSIS — L570 Actinic keratosis: Secondary | ICD-10-CM | POA: Diagnosis not present

## 2021-06-13 ENCOUNTER — Other Ambulatory Visit (HOSPITAL_COMMUNITY): Payer: Self-pay | Admitting: Physician Assistant

## 2021-06-28 ENCOUNTER — Other Ambulatory Visit: Payer: BC Managed Care – PPO | Admitting: *Deleted

## 2021-06-28 ENCOUNTER — Other Ambulatory Visit: Payer: Self-pay

## 2021-06-28 DIAGNOSIS — I480 Paroxysmal atrial fibrillation: Secondary | ICD-10-CM | POA: Diagnosis not present

## 2021-06-28 DIAGNOSIS — Z01812 Encounter for preprocedural laboratory examination: Secondary | ICD-10-CM | POA: Diagnosis not present

## 2021-06-28 LAB — BASIC METABOLIC PANEL
BUN/Creatinine Ratio: 16 (ref 9–20)
BUN: 14 mg/dL (ref 6–24)
CO2: 29 mmol/L (ref 20–29)
Calcium: 9.6 mg/dL (ref 8.7–10.2)
Chloride: 103 mmol/L (ref 96–106)
Creatinine, Ser: 0.88 mg/dL (ref 0.76–1.27)
Glucose: 116 mg/dL — ABNORMAL HIGH (ref 70–99)
Potassium: 4.2 mmol/L (ref 3.5–5.2)
Sodium: 139 mmol/L (ref 134–144)
eGFR: 105 mL/min/{1.73_m2} (ref >59)

## 2021-06-28 LAB — CBC
Hematocrit: 42.5 % (ref 37.5–51.0)
Hemoglobin: 14.3 g/dL (ref 13.0–17.7)
MCH: 29.3 pg (ref 26.6–33.0)
MCHC: 33.6 g/dL (ref 31.5–35.7)
MCV: 87 fL (ref 79–97)
Platelets: 309 10*3/uL (ref 150–450)
RBC: 4.88 x10E6/uL (ref 4.14–5.80)
RDW: 13.7 % (ref 11.6–15.4)
WBC: 5.8 10*3/uL (ref 3.4–10.8)

## 2021-07-13 ENCOUNTER — Telehealth (HOSPITAL_COMMUNITY): Payer: Self-pay | Admitting: Emergency Medicine

## 2021-07-13 NOTE — Telephone Encounter (Signed)
Attempted to call patient regarding upcoming cardiac CT appointment. °Left message on voicemail with name and callback number °Ekin Pilar RN Navigator Cardiac Imaging °[facility] °[phone] Office °[phone] Cell ° °

## 2021-07-14 ENCOUNTER — Telehealth (HOSPITAL_COMMUNITY): Payer: Self-pay | Admitting: Emergency Medicine

## 2021-07-14 NOTE — Telephone Encounter (Signed)
Reaching out to patient to offer assistance regarding upcoming cardiac imaging study; pt verbalizes understanding of appt date/time, parking situation and where to check in, pre-test NPO status and medications ordered, and verified current allergies; name and call back number provided for further questions should they arise Marchia Bond RN Navigator Cardiac Imaging [facility] [phone] office [phone] cell  Denies iv issues Doubling metoprolol  Arrival 730

## 2021-07-15 ENCOUNTER — Other Ambulatory Visit: Payer: Self-pay

## 2021-07-15 ENCOUNTER — Ambulatory Visit (HOSPITAL_COMMUNITY)
Admission: RE | Admit: 2021-07-15 | Discharge: 2021-07-15 | Disposition: A | Discharge status: Home / Self Care | Payer: BC Managed Care – PPO | Source: Ambulatory Visit | Attending: Cardiology | Admitting: Cardiology

## 2021-07-15 DIAGNOSIS — I480 Paroxysmal atrial fibrillation: Secondary | ICD-10-CM | POA: Diagnosis not present

## 2021-07-15 IMAGING — CT CT HEART MORPH/PULM VEIN W/ CM & W/O CA SCORE
2 of 7 series · 11 of 20 positions shown, 13 images · IV contrast (Omni 300)
Comparison: None.
COMPARISON: None.

Addendum:
EXAM:
OVER-READ INTERPRETATION  CT CHEST

The following report is an over-read performed by radiologist Dr.
IJAZATH [REDACTED] on [DATE]. This
over-read does not include interpretation of cardiac or coronary
anatomy or pathology. The coronary calcium score/coronary CTA
interpretation by the cardiologist is attached.
CLINICAL DATA: Atrial fibrillation scheduled for ablation.
Cardiac CTA
TECHNIQUE: A non-contrast, gated CT scan was obtained with axial slices of 3 mm
through the heart for calcium scoring. Calcium scoring was performed
using the Agatston method. A 120 kV retrospective, gated, contrast
cardiac scan was obtained. Gantry rotation speed was 250 msecs and
collimation was 0.6 mm. Nitroglycerin was not given. A delayed scan
was obtained to exclude left atrial appendage thrombus. The 3D
dataset was reconstructed in 5% intervals of the 0-95% of the R-R
cycle. Late systolic phases were analyzed on a dedicated workstation
using MPR, MIP, and VRT modes. The patient received 80 cc of
contrast.

[Series 8: 0-90% · axial · 0.36mm/px · z∈[+1106,+1204]mm · 5 of 2940 slices shown]
[im 490/2940  vessel]
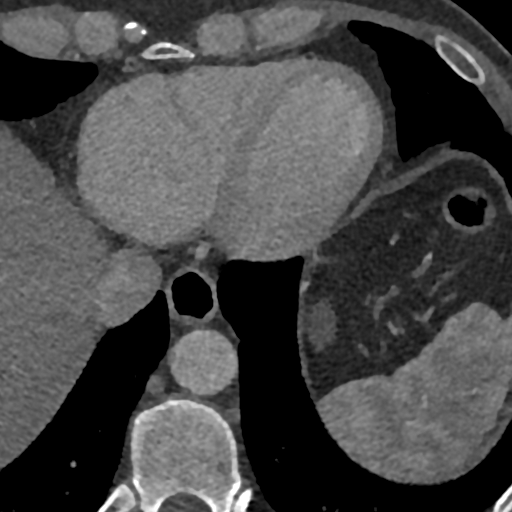
[im 980/2940  vessel]
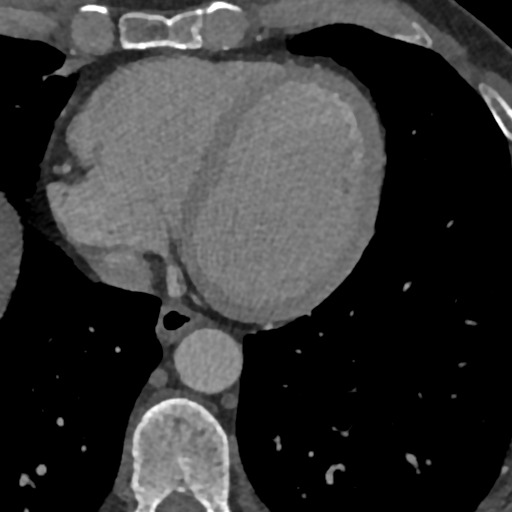
[im 1470/2940  vessel]
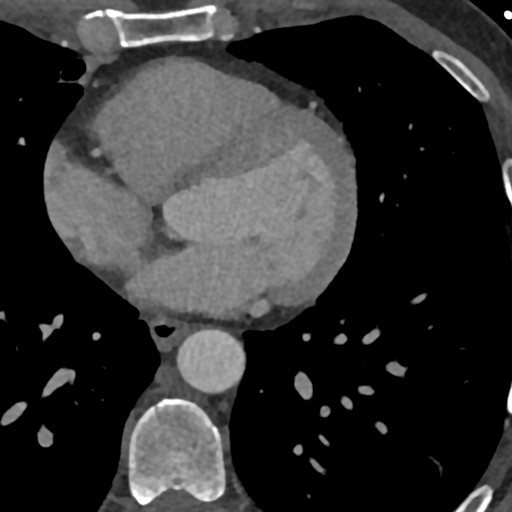
[im 1960/2940  vessel]
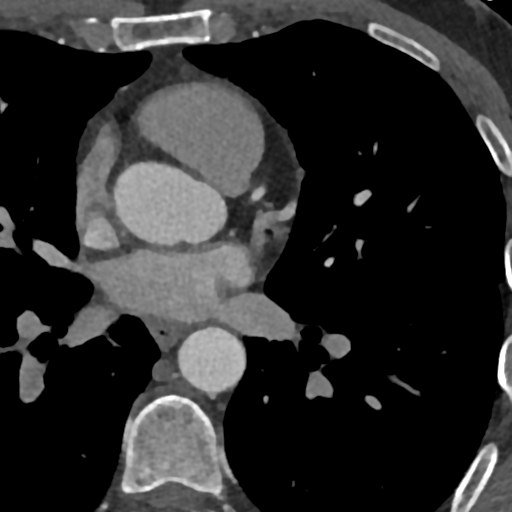
[im 2450/2940  vessel]
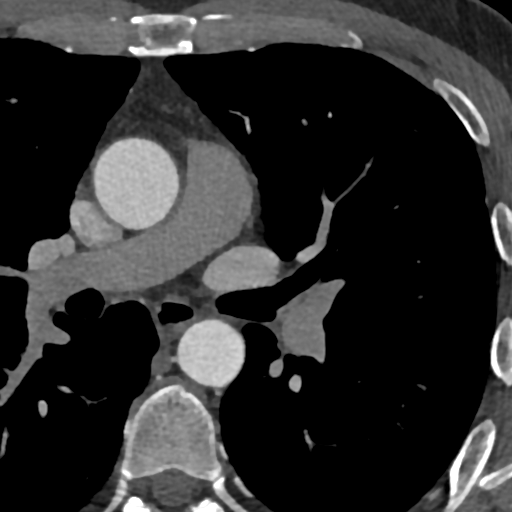

[Series 13: 5-95% · axial · 0.36mm/px · z∈[+1103,+1208]mm · 6 of 2940 slices shown, 8 images]
[im 420/2940  vessel]
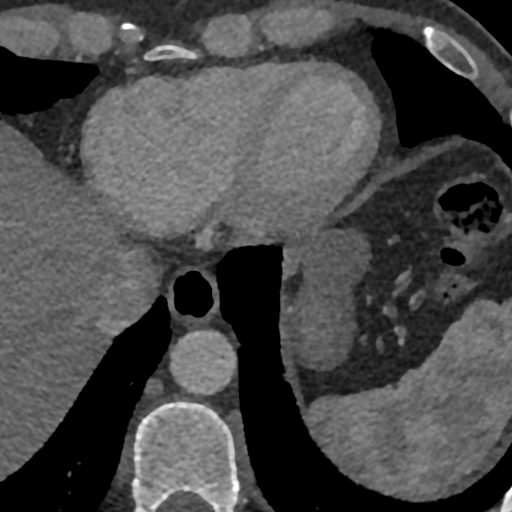
[im 420/2940  lung]
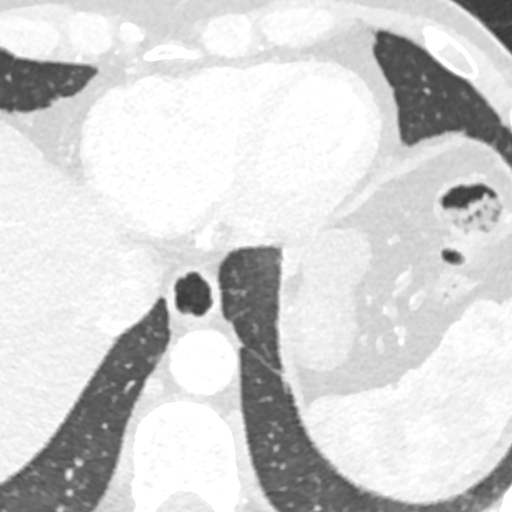
[im 840/2940  vessel]
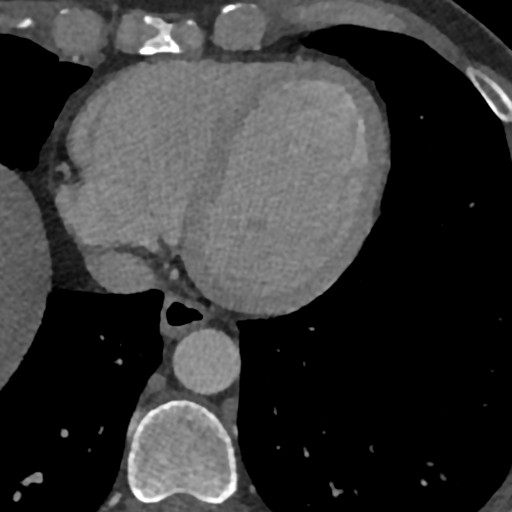
[im 1260/2940  vessel]
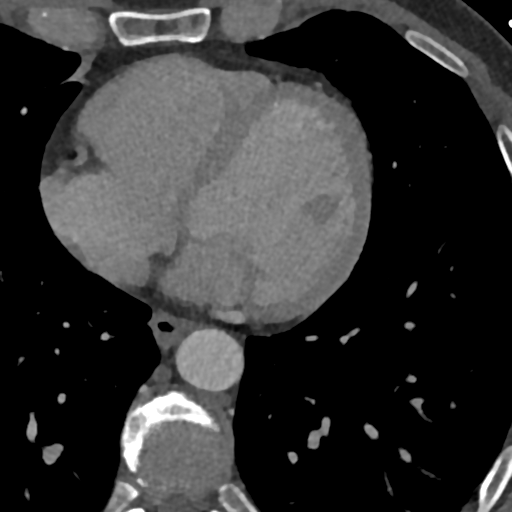
[im 1680/2940  vessel]
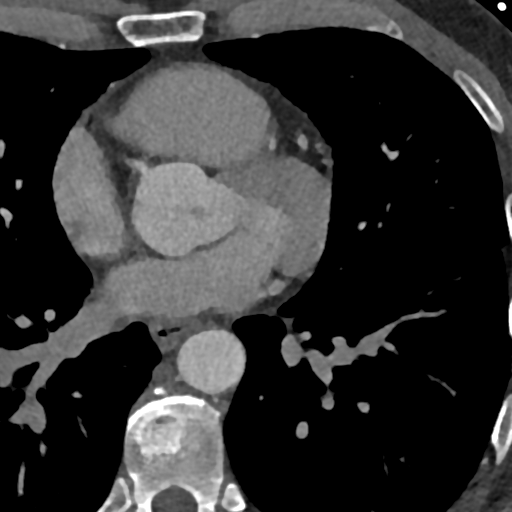
[im 2100/2940  vessel]
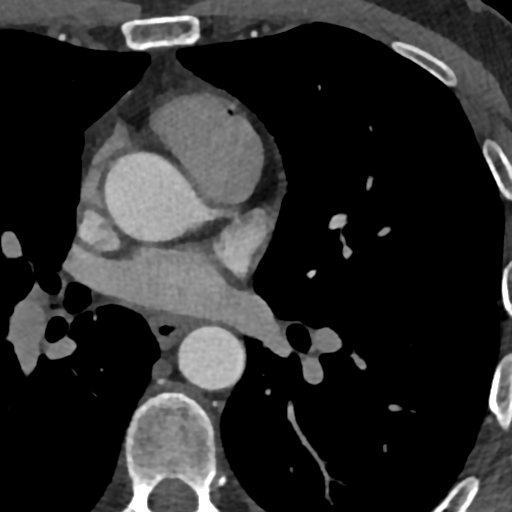
[im 2100/2940  lung]
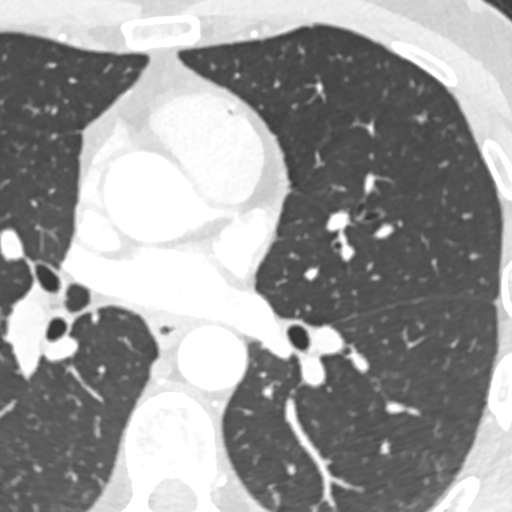
[im 2520/2940  vessel]
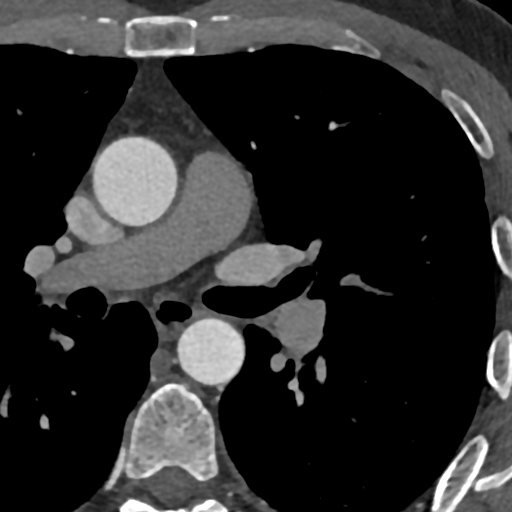

[11 of 20 positions shown; findings below may reference images not displayed]

FINDINGS: Within the visualized portions of the thorax there are no suspicious
appearing pulmonary nodules or masses, there is no acute
consolidative airspace disease, no pleural effusions, no
pneumothorax and no lymphadenopathy. Visualized portions of the
upper abdomen are unremarkable. There are no aggressive appearing
lytic or blastic lesions noted in the visualized portions of the
skeleton.
IMPRESSION: 1. No significant incidental noncardiac findings are noted.
FINDINGS: Image quality: average

Noise artifact is: Limited

Pulmonary Veins: There is normal pulmonary vein drainage into the
left atrium (2 on the right and 2 on the left) with ostial
measurements as follows:

RUPV: Ostium 29.1 x 20.8 mm, area 4.80 cm2

RLPV: Ostium 21.9 x 19.1 mm, area 3.00 cm2

LUPV: Ostium 23.7 x 17.7  mm, area 3.05 cm2

LLPV:  Ostium 17.9 x 16.8 mm, area 2.22 cm2

Left Atrium: The left atrial size is normal. There is no PFO/ASD.
The left atrial appendage is chicken wing type and ostial size
x 19.8 mm. There is no thrombus in the left atrial appendage on
contrast or delayed imaging. The esophagus runs in the left atrial
midline and is not in proximity to any of the pulmonary vein ostia.

Coronary Arteries: CAC score of 0, which is 0 percentile for age-,
race-, and sex-matched controls. Normal coronary origin. Right
dominance. The study was performed without use of NTG and is
insufficient for plaque evaluation.

Pericardium: Normal thickness with no significant effusion or
calcium present.

Pulmonary Artery: Normal caliber without proximal filling defect.

Cardiac valves: The aortic valve is trileaflet without significant
calcification. The mitral valve is normal structure without
significant calcification.

Aorta: Normal caliber with no significant disease.

Extra-cardiac findings: See attached radiology report for
non-cardiac structures.
IMPRESSION: 1. There is normal pulmonary vein drainage into the left atrium with
ostial measurements above.

2. There is no thrombus in the left atrial appendage.

3. The esophagus runs in the left atrial midline and is not in
proximity to any of the pulmonary vein ostia.

4. No PFO/ASD.

5. Normal coronary origin. Right dominance.

6. CAC score of 0 which is 0 percentile for age-, race-, and
sex-matched controls.

*** End of Addendum ***
EXAM:
OVER-READ INTERPRETATION  CT CHEST

The following report is an over-read performed by radiologist Dr.
IJAZATH [REDACTED] on [DATE]. This
over-read does not include interpretation of cardiac or coronary
anatomy or pathology. The coronary calcium score/coronary CTA
interpretation by the cardiologist is attached.
FINDINGS: Within the visualized portions of the thorax there are no suspicious
appearing pulmonary nodules or masses, there is no acute
consolidative airspace disease, no pleural effusions, no
pneumothorax and no lymphadenopathy. Visualized portions of the
upper abdomen are unremarkable. There are no aggressive appearing
lytic or blastic lesions noted in the visualized portions of the
skeleton.
IMPRESSION: 1. No significant incidental noncardiac findings are noted.

## 2021-07-15 MED ORDER — IOHEXOL 350 MG/ML SOLN
100.0000 mL | Freq: Once | INTRAVENOUS | Status: AC | PRN
  Administered 2021-07-15: 100 mL via INTRAVENOUS

## 2021-07-15 MED ORDER — METOPROLOL TARTRATE 5 MG/5ML IV SOLN: 10.0000 mg | Freq: Two times a day (BID) | INTRAVENOUS | Status: DC

## 2021-07-15 MED ORDER — METOPROLOL TARTRATE 5 MG/5ML IV SOLN: 5.0000 mg | INTRAVENOUS | Status: DC | PRN

## 2021-07-15 MED ORDER — METOPROLOL TARTRATE 5 MG/5ML IV SOLN
INTRAVENOUS | Status: AC
  Administered 2021-07-15: 10 mg via INTRAVENOUS
  Filled 2021-07-15: qty 10

## 2021-07-15 MED ORDER — METOPROLOL TARTRATE 5 MG/5ML IV SOLN
INTRAVENOUS | Status: AC
  Administered 2021-07-15: 5 mg via INTRAVENOUS
  Filled 2021-07-15: qty 10

## 2021-07-20 NOTE — Pre-Procedure Instructions (Signed)
Attempted to call patient regarding procedure instructions.  Left voice mail on the following items: Arrival time 0930 Nothing to eat or drink after midnight No meds AM of procedure Responsible person to drive you home and stay with you for 24 hrs  Have you missed any doses of anti-coagulant Eliquis- take both doses today, none in the morning

## 2021-07-21 ENCOUNTER — Encounter (HOSPITAL_COMMUNITY)
Admission: RE | Disposition: A | Discharge status: Home / Self Care | Payer: BC Managed Care – PPO | Source: Ambulatory Visit | Attending: Cardiology

## 2021-07-21 ENCOUNTER — Encounter: Payer: Self-pay | Admitting: Cardiology

## 2021-07-21 ENCOUNTER — Ambulatory Visit (HOSPITAL_COMMUNITY)
Admission: RE | Admit: 2021-07-21 | Discharge: 2021-07-21 | Disposition: A | Discharge status: Home / Self Care | Payer: BC Managed Care – PPO | Source: Ambulatory Visit | Attending: Cardiology | Admitting: Cardiology

## 2021-07-21 ENCOUNTER — Other Ambulatory Visit: Payer: Self-pay

## 2021-07-21 ENCOUNTER — Ambulatory Visit (HOSPITAL_COMMUNITY): Payer: BC Managed Care – PPO | Admitting: Certified Registered Nurse Anesthetist

## 2021-07-21 ENCOUNTER — Ambulatory Visit: Payer: BC Managed Care – PPO | Admitting: Family Medicine

## 2021-07-21 ENCOUNTER — Encounter: Payer: Self-pay | Admitting: Family Medicine

## 2021-07-21 VITALS — BP 121/87 | HR 102 | Temp 98.2°F | Ht 72.0 in | Wt 184.4 lb

## 2021-07-21 DIAGNOSIS — I480 Paroxysmal atrial fibrillation: Secondary | ICD-10-CM | POA: Diagnosis not present

## 2021-07-21 DIAGNOSIS — R21 Rash and other nonspecific skin eruption: Secondary | ICD-10-CM

## 2021-07-21 DIAGNOSIS — Z539 Procedure and treatment not carried out, unspecified reason: Secondary | ICD-10-CM | POA: Diagnosis not present

## 2021-07-21 DIAGNOSIS — I4891 Unspecified atrial fibrillation: Secondary | ICD-10-CM | POA: Diagnosis not present

## 2021-07-21 SURGERY — ATRIAL FIBRILLATION ABLATION
Anesthesia: General

## 2021-07-21 MED ORDER — HEPARIN (PORCINE) IN NACL 1000-0.9 UT/500ML-% IV SOLN
INTRAVENOUS | Status: AC
  Filled 2021-07-21: qty 500

## 2021-07-21 MED ORDER — PREDNISONE 10 MG PO TABS: ORAL_TABLET | ORAL | 0 refills | Status: DC

## 2021-07-21 MED ORDER — PREDNISONE 50 MG PO TABS
60.0000 mg | ORAL_TABLET | ORAL | Status: AC
  Administered 2021-07-21: 60 mg via ORAL
  Filled 2021-07-21: qty 1

## 2021-07-21 MED ORDER — HEPARIN SODIUM (PORCINE) 1000 UNIT/ML IJ SOLN
INTRAMUSCULAR | Status: AC
  Filled 2021-07-21: qty 10

## 2021-07-21 MED ORDER — SODIUM CHLORIDE 0.9 % IV SOLN: INTRAVENOUS | Status: DC

## 2021-07-21 NOTE — Patient Instructions (Signed)
Please return in 1 week with Dr. Yong Channel for recheck   Please start zyrtec 10mg  nightly.  Stop the eloquis. We will contact cardiology to see what to take instead.  Take the prednisone and let us know if anything changes or worsens.   If you have any questions or concerns, please don't hesitate to send me a message via MyChart or call the office at [phone]. Thank you for visiting with [place] today! It's our pleasure caring for you.   Drug Rash A drug rash occurs when a medicine causes a change in the color or texture of the skin. It can develop minutes, hours, or days after you take the medicine. The rash may appear on a small area of skin or all over your body. What are the causes? This condition may be caused by one of these three conditions: An allergic reaction to the medicine. An unwanted side effect of a certain medicine. Extreme sensitivity to sunlight caused by the medicine. What increases the risk? If you take any of these medicines that make your skin sensitive to light and are exposed to sunlight, it can make you more likely to develop this condition: Antibiotics, including tetracyclines and sulfa medicines. Antifungals. Antihistamines. Diuretics. Retinoids, such as isotretinoin. Statins. NSAIDs. What are the signs or symptoms? Symptoms of this condition include: Redness. Tiny bumps. Peeling. Itching. Itchy welts (hives). Swelling. How is this diagnosed? This condition may be diagnosed based on: A physical exam. Tests to find out which medicine caused the rash. These tests may include: Skin tests. Blood tests. How is this treated? This condition is treated with medicines, including: Antihistamine. This may be given to relieve itching. NSAIDs. These may be given to reduce swelling and to treat pain. A steroid medicine. This may be given to reduce swelling. The rash usually goes away when you stop taking the medicine that caused it. Follow these instructions at  home: Take over-the-counter and prescription medicines only as told by your health care provider. Tell all your health care providers about any medicine reactions that you have had in the past. If your rash was caused by sensitivity to sunlight, and while your rash is healing: Avoid being in the sun if possible, especially when it is strongest, usually between 10 a.m. and 4 p.m. Cover your skin with pants, long sleeves, and a hat when you are exposed to sunlight. If you have hives: Take a cool shower or use a cool compress to relieve itchiness. Take over-the-counter antihistamines, as recommended by your health care provider, until the hives are gone. Hives are not contagious. Keep all follow-up visits. This is important. Contact a health care provider if: You have fever. Your rash is not going away. Your rash gets worse. Your rash comes back. You have high-pitched whistling sounds when you breathe, most often when you breathe out (wheezing) or coughing. Get help right away if: You start to have breathing problems. You start to have shortness of breath. Your face or throat starts to swell. You have severe weakness with dizziness or fainting. You have chest pain. Your skin starts to blister and peel. These symptoms may represent a serious problem that is an emergency. Do not wait to see if the symptoms will go away. Get medical help right away. Call your local emergency services (911 in the [place] Do not drive yourself to the hospital. Summary A drug rash occurs when a medicine causes a change in the color or texture of the skin. The rash may appear  on a small area of skin or all over your body. It can develop minutes, hours, or days after you take the medicine. Your health care provider will do various tests to determine what medicine caused your rash. The rash may be treated with medicine to relieve itching, swelling, and pain. This information is not intended to replace advice given to  you by your health care provider. Make sure you discuss any questions you have with your health care provider. Document Revised: 11/16/2020 Document Reviewed: 11/16/2020 Elsevier Patient Education  [place].

## 2021-07-21 NOTE — Progress Notes (Addendum)
Pt states he has a rash that started 07/20/21 evening, he has red dots from top of buttock over total back entire chest. As we stood there talking to the patient / 10 minutes it was then going to thigh tops. Low back rash is welted. States it itches, denies pain, no HX of fever nor sore throat or fatigue. Pt states he feels very nervous. I contacted PA Enedina Finner and Dr Elgie Congo / anesthesia to inform. Orders followed for a one time dose of prednisone. Pt to reschedule.

## 2021-07-21 NOTE — Progress Notes (Signed)
Subjective  CC:  Chief Complaint  Patient presents with   Rash    Rash all over his body - back, neck to groin area, itches, has been taking prednisone    HPI: Casey Zavala is a 51 y.o. male who presents to the office today to address the problems listed above in the chief complaint. 51 year old male with new onset atrial fibrillation who is scheduled for cardiac ablation earlier this morning.  However, it had to be canceled due to new onset rash.  He reports that last night he was at a basketball game as an observer.  He noted that he started to feel little warm and itchy.  This a.m. he was feeling very itchy and flushed.  His wife noted that he had a diffuse rash.  They both thought it was related to nerves due to his procedure.  Rash has been spreading and changing.  No vesicles, flaking, skin peeling.  No mucous membrane involvement.  No involvement of palms or soles.  No fevers, chills or arthralgias.  He otherwise feels perfectly well.  No sore throat or recent illness.  His diet is healthy and unchanged.  He did start Eliquis about 3 weeks ago.  That has been the only medication change.  Cardiology sent him out with 60 mg of prednisone that he took earlier this morning.  During the office visit, he did feel flushed again.  Assessment  1. Maculopapular rash, generalized      Plan  Generalized maculopapular rash: Appearance most consistent with drug reaction/rash.  Does not look infectious.  No other systemic symptoms.  No signs of multiforme erythema.  Will recommend stopping Eliquis, recheck to cardiology to get input on what anticoagulant they would like him to start instead, low-dose prednisone taper for 7 days and Zyrtec 10 mg nightly.  Discussed red flag symptoms including worsening rash, vesicles, ulcerations, or other systemic symptoms.  He will notify this if develop.  Follow-up with pcp in one week for recheck. Baseline labs ordered : note cbc may have reactive leukoctyosis  since had 60mg  pred this am.   Follow up: one week for recheck. Sooner if worsens.   Visit date not found  Orders Placed This Encounter  Procedures   CBC with Differential/Platelet   TSH   Sedimentation rate   Antistreptolysin O titer   Meds ordered this encounter  Medications   predniSONE (DELTASONE) 10 MG tablet    Sig: Take 4 tabs qd x 2 days, 3 qd x 2 days, 2 qd x 2d, 1qd x 3 days    Dispense:  21 tablet    Refill:  0      I reviewed the patients updated PMH, FH, and SocHx.    Patient Active Problem List   Diagnosis Date Noted   Paroxysmal atrial fibrillation ([place]) 03/12/2021   History of skin cancer 10/31/2017   Sinus node dysfunction ([facility]) 09/06/2014   Hyperlipidemia 09/06/2014   Left groin hernia 01/17/2014   Current Meds  Medication Sig   busPIRone (BUSPAR) 5 MG tablet Take 1 tablet (5 mg total) by mouth 2 (two) times daily as needed (anxiety with travel).   metoprolol succinate (TOPROL-XL) 50 MG 24 hr tablet TAKE 1 TABLET BY MOUTH EVERY DAY (Patient taking differently: Take 50 mg by mouth at bedtime.)   predniSONE (DELTASONE) 10 MG tablet Take 4 tabs qd x 2 days, 3 qd x 2 days, 2 qd x 2d, 1qd x 3 days   psyllium (  METAMUCIL) 58.6 % powder Take 1 packet by mouth at bedtime.   Red Yeast Rice Extract (RED YEAST RICE PO) Take 2 capsules by mouth 2 (two) times daily.   [DISCONTINUED] apixaban (ELIQUIS) 5 MG TABS tablet Take 1 tablet (5 mg total) by mouth 2 (two) times daily.    Allergies: Patient has No Known Allergies. Family History: Patient family history includes Dementia in his maternal grandfather; Depression in his sister; Diabetes in his brother, maternal grandmother, and mother; Heart attack in his paternal grandmother; Hypothyroidism in his mother; Kidney disease in his brother; Lung cancer in his paternal grandfather; Schizophrenia in his brother; Stroke in his father. Social History:  Patient  reports that he has never smoked. He has never used smokeless  tobacco. He reports current alcohol use of about 2.0 standard drinks per week. He reports that he does not use drugs.  Review of Systems: Constitutional: Negative for fever malaise or anorexia Cardiovascular: negative for chest pain Respiratory: negative for SOB or persistent cough Gastrointestinal: negative for abdominal pain  Objective  Vitals: BP 121/87 (BP Location: Left Arm, Patient Position: Sitting, Cuff Size: Normal)    Pulse (!) 102    Temp 98.2 F (36.8 C) (Temporal)    Ht 6' (1.829 m)    Wt 184 lb 6.4 oz (83.6 kg)    SpO2 97%    BMI 25.01 kg/m  General: no acute distress , A&Ox3 HEENT: PEERL, conjunctiva normal, neck is supple, oropharynx clear, no cervical lymphadenopathy Cardiovascular:  RRR without murmur or gallop.  Respiratory:  Good breath sounds bilaterally, CTAB with normal respiratory effort Skin:  Warm, torso and upper thighs with diffuse maculopapular rash with some confluence.  No sandpaper feeling.  No vesicles.  No ulcerations.  No palmar erythema.    Commons side effects, risks, benefits, and alternatives for medications and treatment plan prescribed today were discussed, and the patient expressed understanding of the given instructions. Patient is instructed to call or message via MyChart if he/she has any questions or concerns regarding our treatment plan. No barriers to understanding were identified. We discussed Red Flag symptoms and signs in detail. Patient expressed understanding regarding what to do in case of urgent or emergency type symptoms.  Medication list was reconciled, printed and provided to the patient in AVS. Patient instructions and summary information was reviewed with the patient as documented in the AVS. This note was prepared with assistance of Dragon voice recognition software. Occasional wrong-word or sound-a-like substitutions may have occurred due to the inherent limitations of voice recognition software  This visit occurred during the  SARS-CoV-2 public health emergency.  Safety protocols were in place, including screening questions prior to the visit, additional usage of staff PPE, and extensive cleaning of exam room while observing appropriate contact time as indicated for disinfecting solutions.

## 2021-07-21 NOTE — H&P (Signed)
Patient present on the hospital for atrial fibrillation ablation.  On presentation, he was noted to have a rash covering his trunk and extending into his legs and arms and armpits as well as up the back of his neck and into his hairline.  Due to the rash, the procedure was canceled.  He was given 60 mg of prednisone and was instructed to call his primary care physician for further evaluation.  Allegra Lai, MD

## 2021-07-21 NOTE — Anesthesia Preprocedure Evaluation (Addendum)
Anesthesia Evaluation  Patient identified by MRN, date of birth, ID band Patient awake    Reviewed: Allergy & Precautions, NPO status , Patient's Chart, lab work & pertinent test results  Airway Mallampati: II  TM Distance: >3 FB Neck ROM: Full    Dental no notable dental hx.    Pulmonary neg pulmonary ROS,    Pulmonary exam normal breath sounds clear to auscultation       Cardiovascular Exercise Tolerance: Good Normal cardiovascular exam+ dysrhythmias Atrial Fibrillation  Rhythm:Regular Rate:Normal     Neuro/Psych negative neurological ROS  negative psych ROS   GI/Hepatic negative GI ROS, Neg liver ROS,   Endo/Other  hyperlipidemia  Renal/GU negative Renal ROS  negative genitourinary   Musculoskeletal negative musculoskeletal ROS (+)   Abdominal   Peds negative pediatric ROS (+)  Hematology negative hematology ROS (+)   Anesthesia Other Findings   Reproductive/Obstetrics negative OB ROS                            Anesthesia Physical Anesthesia Plan  ASA: 2  Anesthesia Plan: General   Post-op Pain Management: Tylenol PO (pre-op)   Induction: Intravenous  PONV Risk Score and Plan: 2 and Treatment may vary due to age or medical condition  Airway Management Planned: Oral ETT  Additional Equipment: None  Intra-op Plan:   Post-operative Plan: Extubation in OR  Informed Consent: I have reviewed the patients History and Physical, chart, labs and discussed the procedure including the risks, benefits and alternatives for the proposed anesthesia with the patient or authorized representative who has indicated his/her understanding and acceptance.       Plan Discussed with: Anesthesiologist and CRNA  Anesthesia Plan Comments: (Patient with diffuse rapidly spreading rash over the entirety of his body. Evaluated by me and Dr. Curt Bears and decision made to cancel surgery for further  eval of the rash by his PCP. Camnitz ordered the patient steroids. Norton Blizzard, MD  )       Anesthesia Quick Evaluation

## 2021-07-22 ENCOUNTER — Encounter: Payer: Self-pay | Admitting: Family Medicine

## 2021-07-22 LAB — CBC WITH DIFFERENTIAL/PLATELET
Basophils Absolute: 0.1 10*3/uL (ref 0.0–0.1)
Basophils Relative: 0.9 % (ref 0.0–3.0)
Eosinophils Absolute: 0 10*3/uL (ref 0.0–0.7)
Eosinophils Relative: 0.2 % (ref 0.0–5.0)
HCT: 45.9 % (ref 39.0–52.0)
Hemoglobin: 15.2 g/dL (ref 13.0–17.0)
Lymphocytes Relative: 5.2 % — ABNORMAL LOW (ref 12.0–46.0)
Lymphs Abs: 0.5 10*3/uL — ABNORMAL LOW (ref 0.7–4.0)
MCHC: 33.1 g/dL (ref 30.0–36.0)
MCV: 85.6 fl (ref 78.0–100.0)
Monocytes Absolute: 0.1 10*3/uL (ref 0.1–1.0)
Monocytes Relative: 1 % — ABNORMAL LOW (ref 3.0–12.0)
Neutro Abs: 9.2 10*3/uL — ABNORMAL HIGH (ref 1.4–7.7)
Neutrophils Relative %: 92.7 % — ABNORMAL HIGH (ref 43.0–77.0)
Platelets: 312 10*3/uL (ref 150.0–400.0)
RBC: 5.37 Mil/uL (ref 4.22–5.81)
RDW: 13.1 % (ref 11.5–15.5)
WBC: 9.9 10*3/uL (ref 4.0–10.5)

## 2021-07-22 LAB — TSH: TSH: 1.44 u[IU]/mL (ref 0.35–5.50)

## 2021-07-22 LAB — ANTISTREPTOLYSIN O TITER: ASO: <50 IU/mL (ref <200)

## 2021-07-22 LAB — SEDIMENTATION RATE: Sed Rate: 27 mm/hr — ABNORMAL HIGH (ref 0–20)

## 2021-07-22 NOTE — Telephone Encounter (Signed)
Please advise 

## 2021-07-23 ENCOUNTER — Telehealth: Payer: Self-pay | Admitting: Cardiology

## 2021-07-23 NOTE — Telephone Encounter (Signed)
Pt aware I will be in touch next week, after speaking w/ MD, about getting ablation rescheduled. Also aware will address the blood thinner change next week as well. Patient verbalized understanding and agreeable to plan.

## 2021-07-23 NOTE — Telephone Encounter (Signed)
Patient is calling about his MyChart message he sent on 2/1.  He wants to get his ablation rescheduled.

## 2021-07-27 ENCOUNTER — Encounter: Payer: Self-pay | Admitting: Cardiology

## 2021-07-29 ENCOUNTER — Other Ambulatory Visit: Payer: Self-pay

## 2021-07-29 DIAGNOSIS — I480 Paroxysmal atrial fibrillation: Secondary | ICD-10-CM

## 2021-07-29 MED ORDER — RIVAROXABAN 20 MG PO TABS: 20.0000 mg | ORAL_TABLET | Freq: Every day | ORAL | 6 refills | Status: DC

## 2021-07-29 NOTE — Telephone Encounter (Signed)
Outreach made to pt. Pt is re-scheduled for A-Fib ablation with Dr Curt Bears on 10/15/21 at 10:30am.   Labs will be done on 09/21/2021.  Pt has been instructed to start Xarelto 20mg  daily on 09/24/2021. Samples and coupon card have been left at front desk for pt to pick up when he comes in for labs.    Updated Instruction letter has been sent.    Work up complete.

## 2021-09-03 ENCOUNTER — Encounter: Payer: Self-pay | Admitting: Family Medicine

## 2021-09-17 ENCOUNTER — Ambulatory Visit: Payer: BC Managed Care – PPO | Admitting: Internal Medicine

## 2021-09-17 ENCOUNTER — Encounter: Payer: Self-pay | Admitting: Internal Medicine

## 2021-09-17 VITALS — BP 125/80 | HR 102 | Ht 72.0 in | Wt 180.6 lb

## 2021-09-17 DIAGNOSIS — I480 Paroxysmal atrial fibrillation: Secondary | ICD-10-CM

## 2021-09-17 NOTE — Patient Instructions (Signed)
Medication Instructions:  ?Your physician recommends that you continue on your current medications as directed. Please refer to the Current Medication list given to you today. ? ?*If you need a refill on your cardiac medications before your next appointment, please call your pharmacy* ? ? ?Testing/Procedures: ?Your physician has requested that you have an echocardiogram. Echocardiography is a painless test that uses sound waves to create images of your heart. It provides your doctor with information about the size and shape of your heart and how well your heart?s chambers and valves are working. This procedure takes approximately one hour. There are no restrictions for this procedure. ?-- due in October 2023 ? ? ?Follow-Up: ?At [facility], you and your health needs are our priority.  As part of our continuing mission to provide you with exceptional heart care, we have created designated Provider Care Teams.  These Care Teams include your primary Cardiologist (physician) and Advanced Practice Providers (APPs -  Physician Assistants and Nurse Practitioners) who all work together to provide you with the care you need, when you need it. ? ?We recommend signing up for the patient portal called "MyChart".  Sign up information is provided on this After Visit Summary.  MyChart is used to connect with patients for Virtual Visits (Telemedicine).  Patients are able to view lab/test results, encounter notes, upcoming appointments, etc.  Non-urgent messages can be sent to your provider as well.   ?To learn more about what you can do with MyChart, go to [email or web address].   ? ?Your next appointment:   ? ?6 months with Dr. Debara Pickett  ? ?

## 2021-09-17 NOTE — Progress Notes (Signed)
? ? ?OFFICE NOTE ? ?Chief Complaint:  ?Follow-up afib ? ?Primary Care Physician: ?Marin Olp, MD ? ?HPI:  ?Casey Zavala is a pleasant 51 year old male who currently presents for evaluation of tachycardia with alternating tachycardia. Mr. Corbello is asymptomatic in remains active with regular exercise. He denies any chest pain or shortness of breath. He has no exercise intolerance. He's recently noted elevated heart rates for him which are in the 80s and 90s. Alternatively he has noted some heart rates in the 50s. EKG in the office today shows an ectopic atrial rhythm with short PR interval and mild junctional depression, heart rate 88. QTC is 450 ms. PR interval is 106 ms. He reports he is able to get his heart rate elevated with exercise. There is a family history of arrhythmia with his grandfather who had a pacemaker and father who apparently had "heart shocks" for a presumed arrhythmia. ? ?04/10/2019 ? ?Mr. [name] is seen today for follow-up.  He was last seen in 2016 and therefore is considered a new patient.  The time he was having some unexplained tachycardia.  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  Recently he has reported some more shortness of breath and fatigue with exertion.  He is not currently taking any medications.  EKG shows a sinus rhythm at 97, however it was noted he had a history of tachycardia in the past.  He does also have significant anxiety.  He did have exercise treadmill stress testing in 2016 which was negative for ischemia and demonstrated a good exercise workload.  He also reports some episodes of palpitations which occur quite infrequently, in fact his last episode was more than a month ago. ? ?05/19/2020 ? ?Mr. [name] returns today for follow-up.  He reports he has had about 5 episodes over the past year what he thinks are atrial fibrillation.  He does have an apple watch and showed me 2 episodes which do show irregularity but not  necessarily tachycardia.  I do not see clear P waves and these are suspicious for A. fib.  Overall his CHA2DS2-VASc score is 0 without any history of hypertension and young age therefore stroke risk should be low.  It is interesting that he mentioned that his heart rate used to be in the 50s or 60s several years ago and is slowly kind of crept up.  Is now more regularly in the 90s or 100s but occasionally drops down into the 60s.  He reports some feeling of tightness in his throat and significant anxiety.  I did note that labs were performed by his PCP in June 2021 and surprisingly his TSH was low at 1.0.  This seems on the low end of normal however per the reference range less than 0.35 is considered abnormal.  I wonder with his description of some tightness in his throat and tachycardia/palpitations whether he could have hyperthyroidism.  Of note, his mother actually had a thyroid disorder and required radioiodine ablation. ? ?07/14/2020 ? ?Mr. [name] is seen today in follow-up.  He reports he is doing better with respect to his tachycardia.  He says he has only noted one episode of elevated heart rate and generally feels much better on low-dose metoprolol.  He underwent treadmill exercise stress testing and was able to achieve 17 metabolic equivalents of exercise which is significant.  No ischemic changes were noted.  Overall this is low risk and he should be able to exercise  without any concerns.  His thyroid testing was negative.  The only abnormal finding was his cholesterol was elevated 251 total, triglycerides 84, HDL 74 and an LDL of 163.  He is ready discussed this with his PCP.  They are planning some OTC remedies.  Interestingly with his high HDL cholesterol that may actually be cardioprotective and to some extent may offset his high LDL cholesterol.  Nonetheless I would say even if at low risk he should probably target an LDL less than 130 which he may be able to achieve with diet and  exercise. ? ?09/17/2021 ? ?Mr. [name] has been subsequently found to have atrial fibrillation.  He has been followed with the A-fib clinic and has been seen by Dr. Curt Bears.  He is now scheduled for A-fib ablation on April 28.  He underwent CT of the pulmonary veins in preparation for this and was found to have no coronary calcium.  His echo had shown mildly reduced LVEF 45 to 50% which may be rate or rhythm related.  Hopefully this will improve after ablation.  Otherwise he will need guideline directed medical therapy for heart failure.  Blood pressure is excellent today.  He is asymptomatic with NYHA class I symptoms.  He had a number of questions about the A-fib ablation which I tried to answer today but is generally looking forward to the procedure. ? ?PMHx:  ?Past Medical History:  ?Diagnosis Date  ? Allergy   ? Atrial fibrillation ([place])   ? Basal cell carcinoma of skin   ? Hyperlipidemia   ? no meds  ? Left groin hernia   ? actually improved with exercise  ? ? ?Past Surgical History:  ?Procedure Laterality Date  ? BASAL CELL CARCINOMA EXCISION    ? WISDOM TOOTH EXTRACTION    ? ? ?FAMHx:  ?Family History  ?Problem Relation Age of Onset  ? Hypothyroidism Mother   ?     also dementia  ? Diabetes Mother   ? Stroke Father   ? Depression Sister   ? Diabetes Brother   ? Schizophrenia Brother   ? Kidney disease Brother   ? Diabetes Maternal Grandmother   ? Dementia Maternal Grandfather   ? Heart attack Paternal Grandmother   ? Lung cancer Paternal Grandfather   ? Colon cancer Neg Hx   ? Colon polyps Neg Hx   ? Esophageal cancer Neg Hx   ? Rectal cancer Neg Hx   ? Stomach cancer Neg Hx   ? ? ?SOCHx:  ? reports that he has never smoked. He has never used smokeless tobacco. He reports current alcohol use of about 2.0 standard drinks per week. He reports that he does not use drugs. ? ?ALLERGIES:  ?No Known Allergies ? ?ROS: ?A comprehensive review of systems was negative. ? ?HOME MEDS: ?Current Outpatient Medications   ?Medication Sig Dispense Refill  ? busPIRone (BUSPAR) 5 MG tablet Take 1 tablet (5 mg total) by mouth 2 (two) times daily as needed (anxiety with travel). 10 tablet 0  ? metoprolol succinate (TOPROL-XL) 50 MG 24 hr tablet TAKE 1 TABLET BY MOUTH EVERY DAY (Patient taking differently: Take 50 mg by mouth at bedtime.) 90 tablet 1  ? psyllium (METAMUCIL) 58.6 % powder Take 1 packet by mouth at bedtime.    ? Red Yeast Rice Extract (RED YEAST RICE PO) Take 2 capsules by mouth 2 (two) times daily.    ? [START ON 09/24/2021] rivaroxaban (XARELTO) 20 MG TABS tablet Take 1 tablet (20  mg total) by mouth daily with supper. 30 tablet 6  ? ?No current facility-administered medications for this visit.  ? ? ?LABS/IMAGING: ?No results found for this or any previous visit (from the past 48 hour(s)). ?No results found. ? ?WEIGHTS: ?Wt Readings from Last 3 Encounters:  ?09/17/21 180 lb 9.6 oz (81.9 kg)  ?07/21/21 184 lb 6.4 oz (83.6 kg)  ?07/21/21 175 lb (79.4 kg)  ? ? ?VITALS: ?BP 125/80   Pulse (!) 102   Ht 6' (1.829 m)   Wt 180 lb 9.6 oz (81.9 kg)   SpO2 97%   BMI 24.49 kg/m?  ? ?EXAM: ?General appearance: alert and no distress ?Neck: no carotid bruit, no JVD, and thyroid not enlarged, symmetric, no tenderness/mass/nodules ?Lungs: clear to auscultation bilaterally ?Heart: regular rate and rhythm, S1, S2 normal, no murmur, click, rub or gallop ?Abdomen: soft, non-tender; bowel sounds normal; no masses,  no organomegaly ?Extremities: extremities normal, atraumatic, no cyanosis or edema ?Pulses: 2+ and symmetric ?Skin: Skin color, texture, turgor normal. No rashes or lesions ?Neurologic: Grossly normal ?Psych: Pleasant ? ?EKG: ?Sinus tachycardia at 102-personally reviewed ? ?ASSESSMENT: ?PAF - CHADSVASC score of 0 ?Borderline short PR interval, asymptomatic ?Dyspnea/fatigue-negative exercise treadmill stress test (05/2020) ?Nonischemic cardiomyopathy, LVEF 45 to 50%, NYHA class I symptoms (03/2021) ? ?PLAN: ?1.   Mr. Witz was  found to have paroxysmal atrial fibrillation and has been followed closely in the A-fib clinic.  He is scheduled for A-fib ablation next month.  He does have some mild nonischemic cardiomyopathy.  I suspect this will improve after

## 2021-09-21 ENCOUNTER — Other Ambulatory Visit: Payer: BC Managed Care – PPO | Admitting: *Deleted

## 2021-09-21 DIAGNOSIS — I480 Paroxysmal atrial fibrillation: Secondary | ICD-10-CM

## 2021-09-21 LAB — CBC WITH DIFFERENTIAL/PLATELET
Basophils Absolute: 0 10*3/uL (ref 0.0–0.2)
Basos: 1 %
EOS (ABSOLUTE): 0.1 10*3/uL (ref 0.0–0.4)
Eos: 3 %
Hematocrit: 43.3 % (ref 37.5–51.0)
Hemoglobin: 14.4 g/dL (ref 13.0–17.7)
Immature Grans (Abs): 0 10*3/uL (ref 0.0–0.1)
Immature Granulocytes: 0 %
Lymphocytes Absolute: 1.3 10*3/uL (ref 0.7–3.1)
Lymphs: 28 %
MCH: 28.4 pg (ref 26.6–33.0)
MCHC: 33.3 g/dL (ref 31.5–35.7)
MCV: 85 fL (ref 79–97)
Monocytes Absolute: 0.3 10*3/uL (ref 0.1–0.9)
Monocytes: 7 %
Neutrophils Absolute: 2.9 10*3/uL (ref 1.4–7.0)
Neutrophils: 61 %
Platelets: 313 10*3/uL (ref 150–450)
RBC: 5.07 x10E6/uL (ref 4.14–5.80)
RDW: 12.5 % (ref 11.6–15.4)
WBC: 4.7 10*3/uL (ref 3.4–10.8)

## 2021-09-21 LAB — BASIC METABOLIC PANEL
BUN/Creatinine Ratio: 12 (ref 9–20)
BUN: 10 mg/dL (ref 6–24)
CO2: 29 mmol/L (ref 20–29)
Calcium: 9.7 mg/dL (ref 8.7–10.2)
Chloride: 100 mmol/L (ref 96–106)
Creatinine, Ser: 0.85 mg/dL (ref 0.76–1.27)
Glucose: 137 mg/dL — ABNORMAL HIGH (ref 70–99)
Potassium: 3.9 mmol/L (ref 3.5–5.2)
Sodium: 141 mmol/L (ref 134–144)
eGFR: 106 mL/min/{1.73_m2} (ref >59)

## 2021-09-23 ENCOUNTER — Other Ambulatory Visit: Payer: Self-pay | Admitting: Family Medicine

## 2021-09-23 MED ORDER — BUSPIRONE HCL 5 MG PO TABS: 5.0000 mg | ORAL_TABLET | Freq: Two times a day (BID) | ORAL | 0 refills | Status: DC | PRN

## 2021-10-07 ENCOUNTER — Encounter: Payer: Self-pay | Admitting: Cardiology

## 2021-10-07 NOTE — Telephone Encounter (Signed)
Left message

## 2021-10-08 ENCOUNTER — Encounter: Payer: Self-pay | Admitting: *Deleted

## 2021-10-08 NOTE — Telephone Encounter (Signed)
Spoke to pt about procedure  ([place] was [place]). ?Discussed w/ MD, repeat CT not needed prior to procedure.   ?Aware I will send procedure instructions via mychart. ?Patient verbalized understanding and agreeable to plan.  ? ? ?

## 2021-10-13 ENCOUNTER — Encounter: Payer: Self-pay | Admitting: *Deleted

## 2021-10-13 NOTE — Pre-Procedure Instructions (Signed)
Instructed patient on the following items: Arrival time 0830 Nothing to eat or drink after midnight No meds AM of procedure Responsible person to drive you home and stay with you for 24 hrs  Have you missed any doses of anti-coagulant Xarleto- hasn't missed any doses    

## 2021-10-15 ENCOUNTER — Encounter (HOSPITAL_COMMUNITY)
Admission: RE | Disposition: A | Discharge status: Home / Self Care | Payer: BC Managed Care – PPO | Source: Home / Self Care | Attending: Cardiology

## 2021-10-15 ENCOUNTER — Ambulatory Visit (HOSPITAL_COMMUNITY)
Admission: RE | Admit: 2021-10-15 | Discharge: 2021-10-15 | Disposition: A | Discharge status: Home / Self Care | Payer: BC Managed Care – PPO | Attending: Cardiology | Admitting: Cardiology

## 2021-10-15 ENCOUNTER — Encounter (HOSPITAL_COMMUNITY): Payer: Self-pay | Admitting: Cardiology

## 2021-10-15 ENCOUNTER — Ambulatory Visit (HOSPITAL_COMMUNITY): Payer: BC Managed Care – PPO | Admitting: Anesthesiology

## 2021-10-15 DIAGNOSIS — I480 Paroxysmal atrial fibrillation: Secondary | ICD-10-CM | POA: Diagnosis not present

## 2021-10-15 DIAGNOSIS — I4891 Unspecified atrial fibrillation: Secondary | ICD-10-CM | POA: Diagnosis not present

## 2021-10-15 DIAGNOSIS — I471 Supraventricular tachycardia: Secondary | ICD-10-CM | POA: Diagnosis not present

## 2021-10-15 HISTORY — PX: ATRIAL FIBRILLATION ABLATION: EP1191

## 2021-10-15 LAB — POCT ACTIVATED CLOTTING TIME
Activated Clotting Time: 281 seconds
Activated Clotting Time: 317 seconds
Activated Clotting Time: 329 seconds

## 2021-10-15 SURGERY — ATRIAL FIBRILLATION ABLATION
Anesthesia: General

## 2021-10-15 MED ORDER — ACETAMINOPHEN 325 MG PO TABS: 650.0000 mg | ORAL_TABLET | ORAL | Status: DC | PRN

## 2021-10-15 MED ORDER — HEPARIN SODIUM (PORCINE) 1000 UNIT/ML IJ SOLN
INTRAMUSCULAR | Status: DC | PRN
  Administered 2021-10-15: 14000 [IU] via INTRAVENOUS
  Administered 2021-10-15: 2000 [IU] via INTRAVENOUS
  Administered 2021-10-15: 5000 [IU] via INTRAVENOUS

## 2021-10-15 MED ORDER — PROTAMINE SULFATE 10 MG/ML IV SOLN
INTRAVENOUS | Status: DC | PRN
  Administered 2021-10-15: 40 mg via INTRAVENOUS

## 2021-10-15 MED ORDER — HEPARIN SODIUM (PORCINE) 1000 UNIT/ML IJ SOLN
INTRAMUSCULAR | Status: AC
  Filled 2021-10-15: qty 10

## 2021-10-15 MED ORDER — DEXAMETHASONE SODIUM PHOSPHATE 10 MG/ML IJ SOLN
INTRAMUSCULAR | Status: DC | PRN
  Administered 2021-10-15: 10 mg via INTRAVENOUS

## 2021-10-15 MED ORDER — ONDANSETRON HCL 4 MG/2ML IJ SOLN
INTRAMUSCULAR | Status: DC | PRN
  Administered 2021-10-15: 4 mg via INTRAVENOUS

## 2021-10-15 MED ORDER — ROCURONIUM BROMIDE 10 MG/ML (PF) SYRINGE
PREFILLED_SYRINGE | INTRAVENOUS | Status: DC | PRN
  Administered 2021-10-15: 80 mg via INTRAVENOUS

## 2021-10-15 MED ORDER — MIDAZOLAM HCL 2 MG/2ML IJ SOLN
INTRAMUSCULAR | Status: DC | PRN
  Administered 2021-10-15: 2 mg via INTRAVENOUS

## 2021-10-15 MED ORDER — FENTANYL CITRATE (PF) 100 MCG/2ML IJ SOLN
INTRAMUSCULAR | Status: DC | PRN
  Administered 2021-10-15: 100 ug via INTRAVENOUS

## 2021-10-15 MED ORDER — SODIUM CHLORIDE 0.9 % IV SOLN: INTRAVENOUS | Status: DC

## 2021-10-15 MED ORDER — PHENYLEPHRINE HCL-NACL 20-0.9 MG/250ML-% IV SOLN
INTRAVENOUS | Status: DC | PRN
  Administered 2021-10-15: 40 ug/min via INTRAVENOUS

## 2021-10-15 MED ORDER — HEPARIN (PORCINE) IN NACL 1000-0.9 UT/500ML-% IV SOLN
INTRAVENOUS | Status: AC
  Filled 2021-10-15: qty 2500

## 2021-10-15 MED ORDER — LIDOCAINE 2% (20 MG/ML) 5 ML SYRINGE
INTRAMUSCULAR | Status: DC | PRN
  Administered 2021-10-15: 80 mg via INTRAVENOUS

## 2021-10-15 MED ORDER — DOBUTAMINE INFUSION FOR EP/ECHO/NUC (1000 MCG/ML)
INTRAVENOUS | Status: AC
Start: 2021-10-15 — End: ?
  Filled 2021-10-15: qty 250

## 2021-10-15 MED ORDER — DOBUTAMINE INFUSION FOR EP/ECHO/NUC (1000 MCG/ML)
INTRAVENOUS | Status: DC | PRN
  Administered 2021-10-15: 20 ug/kg/min via INTRAVENOUS

## 2021-10-15 MED ORDER — HEPARIN SODIUM (PORCINE) 1000 UNIT/ML IJ SOLN
INTRAMUSCULAR | Status: DC | PRN
  Administered 2021-10-15: 1000 [IU] via INTRAVENOUS

## 2021-10-15 MED ORDER — HEPARIN (PORCINE) IN NACL 2-0.9 UNITS/ML
INTRAMUSCULAR | Status: AC | PRN
  Administered 2021-10-15 (×5): 500 mL

## 2021-10-15 MED ORDER — SUGAMMADEX SODIUM 200 MG/2ML IV SOLN
INTRAVENOUS | Status: DC | PRN
  Administered 2021-10-15: 200 mg via INTRAVENOUS

## 2021-10-15 MED ORDER — PROPOFOL 10 MG/ML IV BOLUS
INTRAVENOUS | Status: DC | PRN
  Administered 2021-10-15: 150 mg via INTRAVENOUS

## 2021-10-15 SURGICAL SUPPLY — 20 items
BAG SNAP BAND KOVER 36X36 (MISCELLANEOUS) ×1 IMPLANT
CATH 8FR REPROCESSED SOUNDSTAR (CATHETERS) ×2 IMPLANT
CATH 8FR SOUNDSTAR REPROCESSED (CATHETERS) IMPLANT
CATH OCTARAY 2.0 F 3-3-3-3-3 (CATHETERS) ×1 IMPLANT
CATH PIGTAIL STEERABLE D1 8.7 (WIRE) ×1 IMPLANT
CATH S CIRCA THERM PROBE 10F (CATHETERS) ×1 IMPLANT
CATH SMTCH THERMOCOOL SF DF (CATHETERS) ×1 IMPLANT
CATH WEBSTER BI DIR CS D-F CRV (CATHETERS) ×1 IMPLANT
CLOSURE PERCLOSE PROSTYLE (VASCULAR PRODUCTS) ×4 IMPLANT
COVER SWIFTLINK CONNECTOR (BAG) ×2 IMPLANT
PACK EP LATEX FREE (CUSTOM PROCEDURE TRAY) ×2
PACK EP LF (CUSTOM PROCEDURE TRAY) ×1 IMPLANT
PAD DEFIB RADIO PHYSIO CONN (PAD) ×2 IMPLANT
PATCH CARTO3 (PAD) ×1 IMPLANT
SHEATH CARTO VIZIGO SM CVD (SHEATH) ×1 IMPLANT
SHEATH PINNACLE 7F 10CM (SHEATH) ×2 IMPLANT
SHEATH PINNACLE 8F 10CM (SHEATH) ×2 IMPLANT
SHEATH PINNACLE 9F 10CM (SHEATH) ×1 IMPLANT
SHEATH PROBE COVER 6X72 (BAG) ×1 IMPLANT
TUBING SMART ABLATE COOLFLOW (TUBING) ×1 IMPLANT

## 2021-10-15 NOTE — Discharge Instructions (Addendum)
Post procedure care instructions ?No driving for 4 days. No lifting over 5 lbs for 1 week. No vigorous or sexual activity for 1 week. You may return to work/your usual activities on 10/23/21. Keep procedure site clean & dry. If you notice increased pain, swelling, bleeding or pus, call/return!  You may shower after 24 hours, but no soaking in baths/hot tubs/pools for 1 week.  ? ? ?You have an appointment set up with the [facility].  Multiple studies have shown that being followed by a dedicated atrial fibrillation clinic in addition to the standard care you receive from your other physicians improves health. We believe that enrollment in the atrial fibrillation clinic will allow [place] to better care for you.  ? ?The phone number to the [facility] is [phone]. The clinic is staffed Monday through Friday from 8:30am to 5pm. ? ?Parking Directions: The clinic is located in the Heart and Vascular Building connected to [facility] ?1)From [employer] turn on to [address] and go to the 3rd entrance  (Heart and Vascular entrance) on the right. ?2)Look to the right for Heart &Vascular Parking Garage. ?3)A code for the entrance is required, for May is 1002.   ?4)Take the elevators to the 1st floor. Registration is in the room with the glass walls at the end of the hallway. ? ?If you have any trouble parking or locating the clinic, please don?t hesitate to call [phone].  ? ? ?Cardiac Ablation, Care After ? ?This sheet gives you information about how to care for yourself after your procedure. Your health care provider may also give you more specific instructions. If you have problems or questions, contact your health care provider. ?What can I expect after the procedure? ?After the procedure, it is common to have: ?Bruising around your puncture site. ?Tenderness around your puncture site. ?Skipped heartbeats. ?Tiredness (fatigue). ? ?Follow these instructions at  home: ?Puncture site care  ?Follow instructions from your health care provider about how to take care of your puncture site. Make sure you: ?If present, leave stitches (sutures), skin glue, or adhesive strips in place. These skin closures may need to stay in place for up to 2 weeks. If adhesive strip edges start to loosen and curl up, you may trim the loose edges. Do not remove adhesive strips completely unless your health care provider tells you to do that. ?If a large square bandage is present, this may be removed 24 hours after surgery.  ?Check your puncture site every day for signs of infection. Check for: ?Redness, swelling, or pain. ?Fluid or blood. If your puncture site starts to bleed, lie down on your back, apply firm pressure to the area, and contact your health care provider. ?Warmth. ?Pus or a bad smell. ?Driving ?Do not drive for at least 4 days after your procedure or however long your health care provider recommends. (Do not resume driving if you have previously been instructed not to drive for other health reasons.) ?Do not drive or use heavy machinery while taking prescription pain medicine. ?Activity ?Avoid activities that take a lot of effort for at least 7 days after your procedure. ?Do not lift anything that is heavier than 5 lb (4.5 kg) for one week.  ?No sexual activity for 1 week.  ?Return to your normal activities as told by your health care provider. Ask your health care provider what activities are safe for you. ?General instructions ?Take over-the-counter and prescription medicines only as told by your health  care provider. ?Do not use any products that contain nicotine or tobacco, such as cigarettes and e-cigarettes. If you need help quitting, ask your health care provider. ?You may shower after 24 hours, but Do not take baths, swim, or use a hot tub for 1 week.  ?Do not drink alcohol for 24 hours after your procedure. ?Keep all follow-up visits as told by your health care provider. This  is important. ?Contact a health care provider if: ?You have redness, mild swelling, or pain around your puncture site. ?You have fluid or blood coming from your puncture site that stops after applying firm pressure to the area. ?Your puncture site feels warm to the touch. ?You have pus or a bad smell coming from your puncture site. ?You have a fever. ?You have chest pain or discomfort that spreads to your neck, jaw, or arm. ?You are sweating a lot. ?You feel nauseous. ?You have a fast or irregular heartbeat. ?You have shortness of breath. ?You are dizzy or light-headed and feel the need to lie down. ?You have pain or numbness in the arm or leg closest to your puncture site. ?Get help right away if: ?Your puncture site suddenly swells. ?Your puncture site is bleeding and the bleeding does not stop after applying firm pressure to the area. ?These symptoms may represent a serious problem that is an emergency. Do not wait to see if the symptoms will go away. Get medical help right away. Call your local emergency services (911 in the [place] Do not drive yourself to the hospital. ?Summary ?After the procedure, it is normal to have bruising and tenderness at the puncture site in your groin, neck, or forearm. ?Check your puncture site every day for signs of infection. ?Get help right away if your puncture site is bleeding and the bleeding does not stop after applying firm pressure to the area. This is a medical emergency. ?This information is not intended to replace advice given to you by your health care provider. Make sure you discuss any questions you have with your health care provider. ?  ? ? ? ?

## 2021-10-15 NOTE — Transfer of Care (Signed)
Immediate Anesthesia Transfer of Care Note ? ?Patient: Casey Zavala ? ?Procedure(s) Performed: ATRIAL FIBRILLATION ABLATION ? ?Patient Location: Cath Lab ? ?Anesthesia Type:General ? ?Level of Consciousness: awake, alert  and oriented ? ?Airway & Oxygen Therapy: Patient Spontanous Breathing and Patient connected to nasal cannula oxygen ? ?Post-op Assessment: Report given to RN and Post -op Vital signs reviewed and stable ? ?Post vital signs: Reviewed and stable ? ?Last Vitals:  ?Vitals Value Taken Time  ?BP 91/67 10/15/21 1245  ?Temp    ?Pulse 78   ?Resp 12 10/15/21 1246  ?SpO2 97%   ?Vitals shown include unvalidated device data. ? ?Last Pain:  ?Vitals:  ? 10/15/21 0918  ?TempSrc:   ?PainSc: 0-No pain  ?   ? ?  ? ?Complications: There were no known notable events for this encounter. ?

## 2021-10-15 NOTE — Progress Notes (Signed)
Pt ambulated without difficulty or bleeding.   Discharged home with wife who will drive and stay with pt x 24 hrs 

## 2021-10-15 NOTE — Anesthesia Procedure Notes (Signed)
Procedure Name: Intubation ?Date/Time: 10/15/2021 10:35 AM ?Performed by: Genelle Bal, CRNA ?Pre-anesthesia Checklist: Patient identified, Emergency Drugs available, Suction available and Patient being monitored ?Patient Re-evaluated:Patient Re-evaluated prior to induction ?Oxygen Delivery Method: Circle system utilized ?Preoxygenation: Pre-oxygenation with 100% oxygen ?Induction Type: IV induction ?Ventilation: Mask ventilation without difficulty ?Laryngoscope Size: Mac and 4 ?Grade View: Grade I ?Tube type: Oral ?Tube size: 7.5 mm ?Number of attempts: 1 ?Airway Equipment and Method: Stylet and Oral airway ?Placement Confirmation: ETT inserted through vocal cords under direct vision, positive ETCO2 and breath sounds checked- equal and bilateral ?Secured at: 23 cm ?Tube secured with: Tape ?Dental Injury: Teeth and Oropharynx as per pre-operative assessment  ?Comments: 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 ? ? ? ? ?

## 2021-10-15 NOTE — H&P (Signed)
? ?Electrophysiology Office Note ? ? ?Date:  10/15/2021  ? ?ID:  Casey Zavala, DOB Sep 11, 1970, MRN [id number] ? ?PCP:  Marin Olp, MD  ?Cardiologist:  H[place] ?Primary Electrophysiologist:  Gaddiel Cullens Meredith Leeds, MD   ? ?Chief Complaint: AF ?  ?History of Present Illness: ?Casey Zavala is a 51 y.o. male who is being seen today for the evaluation of AF at the request of No ref. provider found. Presenting today for electrophysiology evaluation. ? ?He has a history significant for atrial fibrillation and atrial tachycardia.  He was diagnosed with atrial fibrillation in November 2021 on his smart watch.  He noted more frequent irregular rhythms with palpitations despite higher doses of metoprolol.  His longest episode lasted 8 hours.  He feels weak and fatigued when he is in atrial fibrillation.  He is also noticed over the last few years that his heart rate has been rapid.  He was initially put on metoprolol, but this has been increased.  Prior to this, his heart rate was in the 50s to 60s.  He is unaware of his rapid heart rate, but is concerned as he is not used to his heart rates being in the 90s to low 100s. ? ?Today, denies symptoms of palpitations, chest pain, shortness of breath, orthopnea, PND, lower extremity edema, claudication, dizziness, presyncope, syncope, bleeding, or neurologic sequela. The patient is tolerating medications without difficulties. Plan ablation today.  ? ? ?Past Medical History:  ?Diagnosis Date  ? Allergy   ? Atrial fibrillation ([place])   ? Basal cell carcinoma of skin   ? Hyperlipidemia   ? no meds  ? Left groin hernia   ? actually improved with exercise  ? ?Past Surgical History:  ?Procedure Laterality Date  ? BASAL CELL CARCINOMA EXCISION    ? WISDOM TOOTH EXTRACTION    ? ? ? ?Current Facility-Administered Medications  ?Medication Dose Route Frequency Provider Last Rate Last Admin  ? 0.9 %  sodium chloride infusion   Intravenous Continuous Constance Haw, MD 50 mL/hr  at 10/15/21 0925 New Bag at 10/15/21 0925  ? ? ?Allergies:   Eliquis [apixaban]  ? ?Social History:  The patient  reports that he has never smoked. He has never used smokeless tobacco. He reports current alcohol use of about 2.0 standard drinks per week. He reports that he does not use drugs.  ? ?Family History:  The patient's family history includes Dementia in his maternal grandfather; Depression in his sister; Diabetes in his brother, maternal grandmother, and mother; Heart attack in his paternal grandmother; Hypothyroidism in his mother; Kidney disease in his brother; Lung cancer in his paternal grandfather; Schizophrenia in his brother; Stroke in his father.  ? ?ROS:  Please see the history of present illness.   Otherwise, review of systems is positive for none.   All other systems are reviewed and negative.  ? ?PHYSICAL EXAM: ?VS:  BP (!) 124/92   Pulse 92   Temp 99 ?F (37.2 ?C) (Oral)   Resp 17   Ht 6' (1.829 m)   Wt 80.7 kg   SpO2 95%   BMI 24.14 kg/m?  , BMI Body mass index is 24.14 kg/m?. ?GEN: Well nourished, well developed, in no acute distress  ?HEENT: normal  ?Neck: no JVD, carotid bruits, or masses ?Cardiac: RRR; no murmurs, rubs, or gallops,no edema  ?Respiratory:  clear to auscultation bilaterally, normal work of breathing ?GI: soft, nontender, nondistended, + BS ?MS: no deformity or atrophy  ?Skin: warm  and dry ?Neuro:  Strength and sensation are intact ?Psych: euthymic mood, full affect ? ? ?Recent Labs: ?06/03/2021: ALT 13 ?07/21/2021: TSH 1.44 ?09/21/2021: BUN 10; Creatinine, Ser 0.85; Hemoglobin 14.4; Platelets 313; Potassium 3.9; Sodium 141  ? ? ?Lipid Panel  ?   ?Component Value Date/Time  ? CHOL 216 (H) 06/03/2021 0900  ? CHOL 251 (H) 05/19/2020 0933  ? TRIG 49.0 06/03/2021 0900  ? HDL 70.20 06/03/2021 0900  ? HDL 74 05/19/2020 0933  ? CHOLHDL 3 06/03/2021 0900  ? VLDL 9.8 06/03/2021 0900  ? LDLCALC 136 (H) 06/03/2021 0900  ? [place] 163 (H) 05/19/2020 0933  ? ? ? ?Wt Readings from Last 3  Encounters:  ?10/15/21 80.7 kg  ?09/17/21 81.9 kg  ?07/21/21 83.6 kg  ?  ? ? ?Other studies Reviewed: ?Additional studies/ records that were reviewed today include: TTE 04/13/21  ?Review of the above records today demonstrates:  ? 1. Left ventricular ejection fraction, by estimation, is 45 to 50%. The  ?left ventricle has mildly decreased function. The left ventricle has no  ?regional wall motion abnormalities. Left ventricular diastolic parameters  ?are indeterminate.  ? 2. Right ventricular systolic function is normal. The right ventricular  ?size is normal. Tricuspid regurgitation signal is inadequate for assessing  ?PA pressure.  ? 3. The mitral valve is normal in structure. No evidence of mitral valve  ?regurgitation. No evidence of mitral stenosis.  ? 4. The aortic valve has an indeterminant number of cusps. Aortic valve  ?regurgitation is not visualized. No aortic stenosis is present.  ? 5. There is borderline dilatation of the ascending aorta, measuring 37  ?mm.  ? 6. The inferior vena cava is normal in size with greater than 50%  ?respiratory variability, suggesting right atrial pressure of 3 mmHg.  ? ? ?ASSESSMENT AND PLAN: ? ?1.  Paroxysmal atrial fibrillation/atrial tachycardia: LEVELLE EDELEN has presented today for surgery, with the diagnosis of AF.  The various methods of treatment have been discussed with the patient and family. After consideration of risks, benefits and other options for treatment, the patient has consented to  Procedure(s): ?Catheter ablation as a surgical intervention .  Risks include but not limited to complete heart block, stroke, esophageal damage, nerve damage, bleeding, vascular damage, tamponade, perforation, MI, and death. The patient's history has been reviewed, patient examined, no change in status, stable for surgery.  I have reviewed the patient's chart and labs.  Questions were answered to the patient's satisfaction.   ? ?Allegra Lai, MD ?10/15/2021 ?9:39 AM  ? ?

## 2021-10-15 NOTE — Anesthesia Postprocedure Evaluation (Signed)
Anesthesia Post Note ? ?Patient: Casey Zavala ? ?Procedure(s) Performed: ATRIAL FIBRILLATION ABLATION ? ?  ? ?Patient location during evaluation: PACU ?Anesthesia Type: General ?Level of consciousness: awake and alert ?Pain management: pain level controlled ?Vital Signs Assessment: post-procedure vital signs reviewed and stable ?Respiratory status: spontaneous breathing, nonlabored ventilation and respiratory function stable ?Cardiovascular status: blood pressure returned to baseline ?Postop Assessment: no apparent nausea or vomiting ?Anesthetic complications: no ? ? ?There were no known notable events for this encounter. ? ?Last Vitals:  ?Vitals:  ? 10/15/21 1315 10/15/21 1321  ?BP: 97/68 101/69  ?Pulse: 87 87  ?Resp: 15 14  ?Temp:  36.8 ?C  ?SpO2: 99% 99%  ?  ?Last Pain:  ?Vitals:  ? 10/15/21 1321  ?TempSrc: Temporal  ?PainSc: 0-No pain  ? ? ?  ?  ?  ?  ?  ?  ? ?Marthenia Rolling ? ? ? ? ?

## 2021-10-15 NOTE — Anesthesia Preprocedure Evaluation (Addendum)
Anesthesia Evaluation  ?Patient identified by MRN, date of birth, ID band ?Patient awake ? ? ? ?Reviewed: ?Allergy & Precautions, NPO status , Patient's Chart, lab work & pertinent test results ? ?History of Anesthesia Complications ?Negative for: history of anesthetic complications ? ?Airway ?Mallampati: II ? ?TM Distance: >3 FB ?Neck ROM: Full ? ? ? Dental ?no notable dental hx. ? ?  ?Pulmonary ?neg pulmonary ROS,  ?  ?Pulmonary exam normal ? ? ? ? ? ? ? Cardiovascular ?Normal cardiovascular exam+ dysrhythmias Atrial Fibrillation  ? ? ?  ?Neuro/Psych ?negative neurological ROS ? negative psych ROS  ? GI/Hepatic ?negative GI ROS, Neg liver ROS,   ?Endo/Other  ?negative endocrine ROS ? Renal/GU ?negative Renal ROS  ?negative genitourinary ?  ?Musculoskeletal ?negative musculoskeletal ROS ?(+)  ? Abdominal ?  ?Peds ? Hematology ?negative hematology ROS ?(+)   ?Anesthesia Other Findings ?Day of surgery medications reviewed with patient. ? Reproductive/Obstetrics ?negative OB ROS ? ?  ? ? ? ? ? ? ? ? ? ? ? ? ? ?  ?  ? ? ? ? ? ? ? ?Anesthesia Physical ?Anesthesia Plan ? ?ASA: 2 ? ?Anesthesia Plan: General  ? ?Post-op Pain Management: Minimal or no pain anticipated  ? ?Induction: Intravenous ? ?PONV Risk Score and Plan: 2 and Treatment may vary due to age or medical condition, Ondansetron, Dexamethasone and Midazolam ? ?Airway Management Planned: Oral ETT ? ?Additional Equipment: None ? ?Intra-op Plan:  ? ?Post-operative Plan: Extubation in OR ? ?Informed Consent: I have reviewed the patients History and Physical, chart, labs and discussed the procedure including the risks, benefits and alternatives for the proposed anesthesia with the patient or authorized representative who has indicated his/her understanding and acceptance.  ? ? ? ?Dental advisory given ? ?Plan Discussed with: CRNA ? ?Anesthesia Plan Comments:   ? ? ? ? ? ?Anesthesia Quick Evaluation ? ?

## 2021-10-18 ENCOUNTER — Encounter (HOSPITAL_COMMUNITY): Payer: Self-pay | Admitting: Cardiology

## 2021-10-18 DIAGNOSIS — H5213 Myopia, bilateral: Secondary | ICD-10-CM | POA: Diagnosis not present

## 2021-10-18 DIAGNOSIS — H524 Presbyopia: Secondary | ICD-10-CM | POA: Diagnosis not present

## 2021-11-09 ENCOUNTER — Ambulatory Visit (HOSPITAL_COMMUNITY)
Admission: RE | Admit: 2021-11-09 | Discharge: 2021-11-09 | Disposition: A | Discharge status: Home / Self Care | Payer: BC Managed Care – PPO | Source: Ambulatory Visit | Attending: Physician Assistant | Admitting: Physician Assistant

## 2021-11-09 ENCOUNTER — Encounter (HOSPITAL_COMMUNITY): Payer: Self-pay | Admitting: Physician Assistant

## 2021-11-09 VITALS — BP 108/76 | HR 101 | Ht 72.0 in | Wt 179.8 lb

## 2021-11-09 DIAGNOSIS — I480 Paroxysmal atrial fibrillation: Secondary | ICD-10-CM | POA: Diagnosis not present

## 2021-11-09 DIAGNOSIS — I471 Supraventricular tachycardia: Secondary | ICD-10-CM | POA: Diagnosis not present

## 2021-11-09 DIAGNOSIS — Z7902 Long term (current) use of antithrombotics/antiplatelets: Secondary | ICD-10-CM | POA: Diagnosis not present

## 2021-11-09 DIAGNOSIS — Z7901 Long term (current) use of anticoagulants: Secondary | ICD-10-CM | POA: Diagnosis not present

## 2021-11-09 DIAGNOSIS — Z79899 Other long term (current) drug therapy: Secondary | ICD-10-CM | POA: Insufficient documentation

## 2021-11-09 NOTE — Progress Notes (Signed)
Primary Care Physician: Marin Olp, MD Primary Cardiologist: Dr Debara Pickett Primary Electrophysiologist: Dr Curt Bears  Referring Physician: Dr Vicente Males is a 51 y.o. male with a history of atrial fibrillation, atrial tachycardia who presents for follow up in the [facility].  The patient was initially diagnosed with atrial fibrillation 04/2020 on his smart watch. Patient has a CHADS2VASC score of 0. He has noticed more frequent irregular rhythm and heart racing despite increasing metoprolol, 5-6 episodes. His longest episode lasted 8 hours. When in afib he "feels off" and does have palpitations. See strips in phone note 03/05/21.   On follow up today, patient is s/p afib ablation with Dr Curt Bears on 10/15/21. Patient reports that he has done well since the procedure with no afib noted on his smart watch. He denies CP, swallowing pain, or groin issues. No bleeding issues on anticoagulation.   Today, he denies symptoms of palpitations, chest pain, shortness of breath, orthopnea, PND, lower extremity edema, dizziness, presyncope, syncope, snoring, daytime somnolence, bleeding, or neurologic sequela. The patient is tolerating medications without difficulties and is otherwise without complaint today.    Atrial Fibrillation Risk Factors:  he does not have symptoms or diagnosis of sleep apnea. he does not have a history of rheumatic fever. he does have a history of alcohol use. The patient does have a history of early familial atrial fibrillation or other arrhythmias. Father has afib.  he has a BMI of Body mass index is 24.39 kg/m.Marland Kitchen Filed Weights   11/09/21 1133  Weight: 81.6 kg    Family History  Problem Relation Age of Onset   Hypothyroidism Mother        also dementia   Diabetes Mother    Stroke Father    Depression Sister    Diabetes Brother    Schizophrenia Brother    Kidney disease Brother    Diabetes Maternal Grandmother    Dementia  Maternal Grandfather    Heart attack Paternal Grandmother    Lung cancer Paternal Grandfather    Colon cancer Neg Hx    Colon polyps Neg Hx    Esophageal cancer Neg Hx    Rectal cancer Neg Hx    Stomach cancer Neg Hx      Atrial Fibrillation Management history:  Previous antiarrhythmic drugs: none Previous cardioversions: none Previous ablations: 10/15/21 CHADS2VASC score: 0 Anticoagulation history: Xarelto    Past Medical History:  Diagnosis Date   Allergy    Atrial fibrillation ([place])    Basal cell carcinoma of skin    Hyperlipidemia    no meds   Left groin hernia    actually improved with exercise   Past Surgical History:  Procedure Laterality Date   ATRIAL FIBRILLATION ABLATION N/A 10/15/2021   Procedure: ATRIAL FIBRILLATION ABLATION;  Surgeon: Constance Haw, MD;  Location: [facility];  Service: Cardiovascular;  Laterality: N/A;   BASAL CELL CARCINOMA EXCISION     WISDOM TOOTH EXTRACTION      Current Outpatient Medications  Medication Sig Dispense Refill   busPIRone (BUSPAR) 5 MG tablet Take 1 tablet (5 mg total) by mouth 2 (two) times daily as needed (anxiety with travel). 10 tablet 0   metoprolol succinate (TOPROL-XL) 50 MG 24 hr tablet TAKE 1 TABLET BY MOUTH EVERY DAY 90 tablet 1   psyllium (METAMUCIL) 58.6 % powder Take 1 packet by mouth at bedtime.     Red Yeast Rice Extract (RED YEAST RICE PO)  Take 2 capsules by mouth 2 (two) times daily.     rivaroxaban (XARELTO) 20 MG TABS tablet Take 1 tablet (20 mg total) by mouth daily with supper. 30 tablet 6   No current facility-administered medications for this encounter.    Allergies  Allergen Reactions   Eliquis [Apixaban] Rash    Social History   Socioeconomic History   Marital status: Married    Spouse name: Not on file   Number of children: 1   Years of education: college   Highest education level: Not on file  Occupational History   Occupation: Development worker, international aid    Employer: [employer]  Tobacco Use   Smoking status: Never   Smokeless tobacco: Never   Tobacco comments:    Never smoke 11/09/21  Substance and Sexual Activity   Alcohol use: Yes    Alcohol/week: 2.0 standard drinks    Types: 1 Glasses of wine, 1 Cans of beer per week    Comment: 1-2 beers every other night 11/09/21   Drug use: No   Sexual activity: Not on file  Other Topics Concern   Not on file  Social History Narrative   Married with 72 year old daughter in 2019      [employer] Development worker, international aid. Hoping to retire around 15   Quaker background      Hobbies: exercising   Social Determinants of Radio broadcast assistant Strain: Not on file  Food Insecurity: Not on file  Transportation Needs: Not on file  Physical Activity: Not on file  Stress: Not on file  Social Connections: Not on file  Intimate Partner Violence: Not on file     ROS- All systems are reviewed and negative except as per the HPI above.  Physical Exam: Vitals:   11/09/21 1133  BP: 108/76  Pulse: (!) 101  Weight: 81.6 kg  Height: 6' (1.829 m)     GEN- The patient is a well appearing male, alert and oriented x 3 today.   HEENT-head normocephalic, atraumatic, sclera clear, conjunctiva pink, hearing intact, trachea midline. Lungs- Clear to ausculation bilaterally, normal work of breathing Heart- Regular rate and rhythm, no murmurs, rubs or gallops  GI- soft, NT, ND, + BS Extremities- no clubbing, cyanosis, or edema MS- no significant deformity or atrophy Skin- no rash or lesion Psych- euthymic mood, full affect Neuro- strength and sensation are intact   Wt Readings from Last 3 Encounters:  11/09/21 81.6 kg  10/15/21 80.7 kg  09/17/21 81.9 kg    EKG today demonstrates  Sinus tach vs Atrial tach Vent. rate 101 BPM PR interval 96 ms QRS duration 88 ms QT/QTcB 340/440 ms  Echo 04/13/21 demonstrated   1. Left ventricular ejection fraction, by estimation, is 45 to 50%. The  left ventricle has mildly  decreased function. The left ventricle has no  regional wall motion abnormalities. Left ventricular diastolic parameters are indeterminate.   2. Right ventricular systolic function is normal. The right ventricular  size is normal. Tricuspid regurgitation signal is inadequate for assessing PA pressure.   3. The mitral valve is normal in structure. No evidence of mitral valve  regurgitation. No evidence of mitral stenosis.   4. The aortic valve has an indeterminant number of cusps. Aortic valve regurgitation is not visualized. No aortic stenosis is present.   5. There is borderline dilatation of the ascending aorta, measuring 37  mm.   6. The inferior vena cava is normal in size with greater than 50%  respiratory variability, suggesting right atrial pressure of 3 mmHg.    Epic records are reviewed at length today  CHA2DS2-VASc Score = 0  The patient's score is based upon: CHF History: 0 HTN History: 0 Diabetes History: 0 Stroke History: 0 Vascular Disease History: 0 Age Score: 0 Gender Score: 0       ASSESSMENT AND PLAN: 1. Paroxysmal Atrial Fibrillation/atrial tach The patient's CHA2DS2-VASc score is 0, indicating a 0.2% annual risk of stroke.   Patient is s/p afib ablation 10/15/21 Continue Toprol 50 mg daily Continue Xarelto 20 mg daily with no missed doses for 3 months post ablation.  Smart watch for home monitoring.    Follow up with Dr Curt Bears as scheduled.    [facility] [address] [place], [place] [address] [phone] 11/09/2021 11:58 AM

## 2021-11-12 ENCOUNTER — Ambulatory Visit (HOSPITAL_COMMUNITY): Payer: BC Managed Care – PPO | Admitting: Physician Assistant

## 2021-12-09 ENCOUNTER — Encounter: Payer: Self-pay | Admitting: Family Medicine

## 2022-01-11 ENCOUNTER — Other Ambulatory Visit (HOSPITAL_COMMUNITY): Payer: Self-pay | Admitting: Cardiology

## 2022-01-17 ENCOUNTER — Ambulatory Visit: Payer: 59 | Admitting: Cardiology

## 2022-01-17 ENCOUNTER — Encounter: Payer: Self-pay | Admitting: Cardiology

## 2022-01-17 VITALS — BP 110/78 | HR 90 | Ht 72.0 in | Wt 183.8 lb

## 2022-01-17 DIAGNOSIS — I480 Paroxysmal atrial fibrillation: Secondary | ICD-10-CM

## 2022-01-17 NOTE — Progress Notes (Signed)
Electrophysiology Office Note   Date:  01/17/2022   ID:  KEYWON MESTRE, DOB 26-Oct-1970, MRN [id number]  PCP:  Marin Olp, MD  Cardiologist:  Debara Pickett Primary Electrophysiologist:  Aero Drummonds Meredith Leeds, MD    Chief Complaint: AF   History of Present Illness: Casey Zavala is a 51 y.o. male who is being seen today for the evaluation of AF at the request of Marin Olp, MD. Presenting today for electrophysiology evaluation.  He has a history significant for atrial fibrillation and atrial tachycardia.  He was diagnosed with atrial fibrillation November 2021 on his smart watch.  He noted more frequent irregular rhythms despite higher doses of metoprolol.  His longest episode lasted for 8 hours.  He is now status post ablation 10/15/2021.  Today, denies symptoms of palpitations, chest pain, shortness of breath, orthopnea, PND, lower extremity edema, claudication, dizziness, presyncope, syncope, bleeding, or neurologic sequela. The patient is tolerating medications without difficulties.  He is currently feeling well.  He has no chest pain or shortness of breath.  He has not noted any further episodes of atrial fibrillation since his ablation.   Past Medical History:  Diagnosis Date   Allergy    Atrial fibrillation ([place])    Basal cell carcinoma of skin    Hyperlipidemia    no meds   Left groin hernia    actually improved with exercise   Past Surgical History:  Procedure Laterality Date   ATRIAL FIBRILLATION ABLATION N/A 10/15/2021   Procedure: ATRIAL FIBRILLATION ABLATION;  Surgeon: Constance Haw, MD;  Location: [facility];  Service: Cardiovascular;  Laterality: N/A;   BASAL CELL CARCINOMA EXCISION     WISDOM TOOTH EXTRACTION       Current Outpatient Medications  Medication Sig Dispense Refill   busPIRone (BUSPAR) 5 MG tablet Take 1 tablet (5 mg total) by mouth 2 (two) times daily as needed (anxiety with travel). (Patient taking differently: Take 5 mg  by mouth as needed (anxiety with travel).) 10 tablet 0   psyllium (METAMUCIL) 58.6 % powder Take 1 packet by mouth at bedtime.     Red Yeast Rice Extract (RED YEAST RICE PO) Take 2 capsules by mouth 2 (two) times daily.     No current facility-administered medications for this visit.    Allergies:   Eliquis [apixaban]   Social History:  The patient  reports that he has never smoked. He has never used smokeless tobacco. He reports current alcohol use of about 2.0 standard drinks of alcohol per week. He reports that he does not use drugs.   Family History:  The patient's family history includes Dementia in his maternal grandfather; Depression in his sister; Diabetes in his brother, maternal grandmother, and mother; Heart attack in his paternal grandmother; Hypothyroidism in his mother; Kidney disease in his brother; Lung cancer in his paternal grandfather; Schizophrenia in his brother; Stroke in his father.   ROS:  Please see the history of present illness.   Otherwise, review of systems is positive for none.   All other systems are reviewed and negative.   PHYSICAL EXAM: VS:  BP 110/78   Pulse 90   Ht 6' (1.829 m)   Wt 183 lb 12.8 oz (83.4 kg)   SpO2 97%   BMI 24.93 kg/m  , BMI Body mass index is 24.93 kg/m. GEN: Well nourished, well developed, in no acute distress  HEENT: normal  Neck: no JVD, carotid bruits, or masses Cardiac: RRR; no murmurs,  rubs, or gallops,no edema  Respiratory:  clear to auscultation bilaterally, normal work of breathing GI: soft, nontender, nondistended, + BS MS: no deformity or atrophy  Skin: warm and dry Neuro:  Strength and sensation are intact Psych: euthymic mood, full affect  EKG:  EKG is ordered today. Personal review of the ekg ordered shows rhythm, rate 90  Recent Labs: 06/03/2021: ALT 13 07/21/2021: TSH 1.44 09/21/2021: BUN 10; Creatinine, Ser 0.85; Hemoglobin 14.4; Platelets 313; Potassium 3.9; Sodium 141    Lipid Panel     Component Value  Date/Time   CHOL 216 (H) 06/03/2021 0900   CHOL 251 (H) 05/19/2020 0933   TRIG 49.0 06/03/2021 0900   HDL 70.20 06/03/2021 0900   HDL 74 05/19/2020 0933   CHOLHDL 3 06/03/2021 0900   VLDL 9.8 06/03/2021 0900   LDLCALC 136 (H) 06/03/2021 0900   LDLCALC 163 (H) 05/19/2020 0933     Wt Readings from Last 3 Encounters:  01/17/22 183 lb 12.8 oz (83.4 kg)  11/09/21 179 lb 12.8 oz (81.6 kg)  10/15/21 178 lb (80.7 kg)      Other studies Reviewed: Additional studies/ records that were reviewed today include: TTE 04/13/21  Review of the above records today demonstrates:   1. Left ventricular ejection fraction, by estimation, is 45 to 50%. The  left ventricle has mildly decreased function. The left ventricle has no  regional wall motion abnormalities. Left ventricular diastolic parameters  are indeterminate.   2. Right ventricular systolic function is normal. The right ventricular  size is normal. Tricuspid regurgitation signal is inadequate for assessing  PA pressure.   3. The mitral valve is normal in structure. No evidence of mitral valve  regurgitation. No evidence of mitral stenosis.   4. The aortic valve has an indeterminant number of cusps. Aortic valve  regurgitation is not visualized. No aortic stenosis is present.   5. There is borderline dilatation of the ascending aorta, measuring 37  mm.   6. The inferior vena cava is normal in size with greater than 50%  respiratory variability, suggesting right atrial pressure of 3 mmHg.    ASSESSMENT AND PLAN:  1.  Paroxysmal atrial fibrillation/atrial tachycardia: CHA2DS2-VASc of 0.  Currently on Eliquis 5 mg twice daily.  Status post ablation 10/15/2021.  Pain in sinus rhythm.  We Tadd Holtmeyer stop metoprolol and Xarelto.   Current medicines are reviewed at length with the patient today.   The patient does not have concerns regarding his medicines.  The following changes were made today:  stop xarelto, metoprolol  Labs/ tests ordered  today include:  Orders Placed This Encounter  Procedures   EKG 12-Lead     Disposition:   FU 6 months  Signed, Mayana Irigoyen Meredith Leeds, MD  01/17/2022 12:04 PM     [place] [address] [phone] (office) [phone] (fax)

## 2022-01-17 NOTE — Patient Instructions (Signed)
Medication Instructions:  Your physician has recommended you make the following change in your medication:  STOP Xarelto STOP Toprol  *If you need a refill on your cardiac medications before your next appointment, please call your pharmacy*   Lab Work: None ordered   Testing/Procedures: None ordered   Follow-Up: At [facility], you and your health needs are our priority.  As part of our continuing mission to provide you with exceptional heart care, we have created designated Provider Care Teams.  These Care Teams include your primary Cardiologist (physician) and Advanced Practice Providers (APPs -  Physician Assistants and Nurse Practitioners) who all work together to provide you with the care you need, when you need it.  Your next appointment:   6 month(s)  The format for your next appointment:   In Person  Provider:   You will follow up in the [facility] located at [facility]. Your provider will be: Roderic Palau, NP or Clint R. Fenton, PA-C    Thank you for choosing [facility]!!   Trinidad Curet, RN [phone]  Other Instructions   Important Information About Sugar

## 2022-03-14 ENCOUNTER — Encounter: Payer: Self-pay | Admitting: *Deleted

## 2022-03-28 ENCOUNTER — Ambulatory Visit (HOSPITAL_COMMUNITY): Payer: 59 | Attending: Internal Medicine

## 2022-03-28 DIAGNOSIS — I480 Paroxysmal atrial fibrillation: Secondary | ICD-10-CM | POA: Insufficient documentation

## 2022-03-28 LAB — ECHOCARDIOGRAM COMPLETE
Area-P 1/2: 3.21 cm2
S' Lateral: 3.7 cm

## 2022-04-04 ENCOUNTER — Other Ambulatory Visit (HOSPITAL_COMMUNITY): Payer: Self-pay | Admitting: Cardiology

## 2022-05-05 ENCOUNTER — Encounter: Payer: Self-pay | Admitting: Family Medicine

## 2022-05-05 ENCOUNTER — Encounter (HOSPITAL_BASED_OUTPATIENT_CLINIC_OR_DEPARTMENT_OTHER): Payer: Self-pay | Admitting: Internal Medicine

## 2022-05-06 ENCOUNTER — Ambulatory Visit: Payer: 59 | Attending: Cardiology | Admitting: Cardiology

## 2022-05-06 ENCOUNTER — Encounter: Payer: Self-pay | Admitting: Cardiology

## 2022-05-06 VITALS — BP 122/88 | HR 106 | Ht 72.0 in | Wt 186.4 lb

## 2022-05-06 DIAGNOSIS — I480 Paroxysmal atrial fibrillation: Secondary | ICD-10-CM | POA: Diagnosis not present

## 2022-05-06 MED ORDER — CARVEDILOL 6.25 MG PO TABS
6.2500 mg | ORAL_TABLET | Freq: Two times a day (BID) | ORAL | 3 refills | Status: DC
Start: 2022-05-06 — End: 2023-04-27

## 2022-05-06 NOTE — Progress Notes (Signed)
Cardiology Office Note   Date:  05/06/2022   ID:  Casey Zavala, DOB June 30, 1970, MRN [id number]  PCP:  Marin Olp, MD  Cardiologist:   None Referring:  Marin Olp, MD  Chief Complaint  Patient presents with   Palpitations      History of Present Illness: Casey Zavala is a 51 y.o. male who presents for evaluation of rapid heart rate.   He has been seen by [facility] in the past for evaluation of atrial fibrillation.  He is status post ablation in April 2023.  He has had a mildly reduced ejection fraction with an EF of 45 to 50% in 2022.  He had a follow-up echo in October of this year with his EF 50 to 55%.  There were no valvular abnormalities.  He had no significant valvular abnormalities.  He today because his Apple Watch demonstrated high heart rates over 120.  He went for a walk yesterday and his heart rate stayed elevated for more than 2 hours.  He said he has been noticing his heart rate going higher.  He says his resting rate is above 100 typically.  He has felt more anxiety around this.  He is felt more fatigued.  Yesterday when he had his higher heart rate after walking he felt tired but did not have any presyncope or syncope.  He is not having any chest pressure, neck or arm discomfort.  He is not having any new shortness of breath, PND or orthopnea.   Past Medical History:  Diagnosis Date   Allergy    Atrial fibrillation ([place])    Basal cell carcinoma of skin    Hyperlipidemia    no meds   Left groin hernia    actually improved with exercise    Past Surgical History:  Procedure Laterality Date   ATRIAL FIBRILLATION ABLATION N/A 10/15/2021   Procedure: ATRIAL FIBRILLATION ABLATION;  Surgeon: Constance Haw, MD;  Location: [facility];  Service: Cardiovascular;  Laterality: N/A;   BASAL CELL CARCINOMA EXCISION     WISDOM TOOTH EXTRACTION       Current Outpatient Medications  Medication Sig Dispense Refill   busPIRone (BUSPAR)  5 MG tablet Take 1 tablet (5 mg total) by mouth 2 (two) times daily as needed (anxiety with travel). (Patient taking differently: Take 5 mg by mouth as needed (anxiety with travel).) 10 tablet 0   carvedilol (COREG) 6.25 MG tablet Take 1 tablet (6.25 mg total) by mouth 2 (two) times daily. 180 tablet 3   psyllium (METAMUCIL) 58.6 % powder Take 1 packet by mouth at bedtime.     Red Yeast Rice Extract (RED YEAST RICE PO) Take 2 capsules by mouth 2 (two) times daily.     No current facility-administered medications for this visit.    Allergies:   Eliquis [apixaban]    Social History:  The patient  reports that he has never smoked. He has never used smokeless tobacco. He reports current alcohol use of about 2.0 standard drinks of alcohol per week. He reports that he does not use drugs.   Family History:  The patient's family history includes Dementia in his maternal grandfather; Depression in his sister; Diabetes in his brother, maternal grandmother, and mother; Heart attack in his paternal grandmother; Hypothyroidism in his mother; Kidney disease in his brother; Lung cancer in his paternal grandfather; Schizophrenia in his brother; Stroke in his father.    ROS:  Please see the history  of present illness.   Otherwise, review of systems are positive for none.   All other systems are reviewed and negative.    PHYSICAL EXAM: VS:  BP 122/88   Pulse (!) 106   Ht 6' (1.829 m)   Wt 186 lb 6.4 oz (84.6 kg)   SpO2 99%   BMI 25.28 kg/m  , BMI Body mass index is 25.28 kg/m. GENERAL:  Well appearing HEENT:  Pupils equal round and reactive, fundi not visualized, oral mucosa unremarkable NECK:  No jugular venous distention, waveform within normal limits, carotid upstroke brisk and symmetric, no bruits, no thyromegaly LYMPHATICS:  No cervical, inguinal adenopathy LUNGS:  Clear to auscultation bilaterally BACK:  No CVA tenderness CHEST:  Unremarkable HEART:  PMI not displaced or sustained,S1 and S2  within normal limits, no S3, no S4, no clicks, no rubs, no murmurs ABD:  Flat, positive bowel sounds normal in frequency in pitch, no bruits, no rebound, no guarding, no midline pulsatile mass, no hepatomegaly, no splenomegaly EXT:  2 plus pulses throughout, no edema, no cyanosis no clubbing SKIN:  No rashes no nodules NEURO:  Cranial nerves II through XII grossly intact, motor grossly intact throughout PSYCH:  Cognitively intact, oriented to person place and time    EKG:  EKG is ordered today. The ekg ordered today demonstrates ectopic atrial tachycardia, rate 106, axis within normal limits, intervals within normal limits, no acute ST-T wave changes.   Recent Labs: 06/03/2021: ALT 13 07/21/2021: TSH 1.44 09/21/2021: BUN 10; Creatinine, Ser 0.85; Hemoglobin 14.4; Platelets 313; Potassium 3.9; Sodium 141    Lipid Panel    Component Value Date/Time   CHOL 216 (H) 06/03/2021 0900   CHOL 251 (H) 05/19/2020 0933   TRIG 49.0 06/03/2021 0900   HDL 70.20 06/03/2021 0900   HDL 74 05/19/2020 0933   CHOLHDL 3 06/03/2021 0900   VLDL 9.8 06/03/2021 0900   LDLCALC 136 (H) 06/03/2021 0900   LDLCALC 163 (H) 05/19/2020 0933      Wt Readings from Last 3 Encounters:  05/06/22 186 lb 6.4 oz (84.6 kg)  01/17/22 183 lb 12.8 oz (83.4 kg)  11/09/21 179 lb 12.8 oz (81.6 kg)      Other studies Reviewed: Additional studies/ records that were reviewed today include: Previous EP, EKGs and primary cardiology records. Review of the above records demonstrates:  Please see elsewhere in the note.     ASSESSMENT AND PLAN:  Tachycardia: Since at least 2016 has had an ectopic atrial rhythm.  We had a long discussion about the physiology of this.  He felt tired on a low-dose of metoprolol I have suggested restarting low-dose of carvedilol given the tachycardia and the mildly reduced ejection fraction.  I will schedule him back to see Dr. Curt Bears   Cardiomyopathy: His ejection fraction was low normal  recently.  No change in therapy.   Current medicines are reviewed at length with the patient today.  The patient does not have concerns regarding medicines.  The following changes have been made:  no change  Labs/ tests ordered today include: None  Orders Placed This Encounter  Procedures   EKG 12-Lead     Disposition:   FU with Dr. Curt Bears    Signed, Minus Breeding, MD  05/06/2022 5:23 PM    [place]

## 2022-05-06 NOTE — Telephone Encounter (Signed)
Patient informed he has been scheduled with Dr. Percival Spanish at 4:30 today. He accepted.

## 2022-05-06 NOTE — Patient Instructions (Addendum)
Medication Instructions:  Start Carvedilol 6.125 mg  one tablet twice daily   *If you need a refill on your cardiac medications before your next appointment, please call your pharmacy*   Lab Work: NONE ordered at this time of appointment   If you have labs (blood work) drawn today and your tests are completely normal, you will receive your results only by: [place] (if you have MyChart) OR A paper copy in the mail If you have any lab test that is abnormal or we need to change your treatment, we will call you to review the results.   Testing/Procedures: NONE ordered at this time of appointment     Follow-Up: At [facility], you and your health needs are our priority.  As part of our continuing mission to provide you with exceptional heart care, we have created designated Provider Care Teams.  These Care Teams include your primary Cardiologist (physician) and Advanced Practice Providers (APPs -  Physician Assistants and Nurse Practitioners) who all work together to provide you with the care you need, when you need it.  We recommend signing up for the patient portal called "MyChart".  Sign up information is provided on this After Visit Summary.  MyChart is used to connect with patients for Virtual Visits (Telemedicine).  Patients are able to view lab/test results, encounter notes, upcoming appointments, etc.  Non-urgent messages can be sent to your provider as well.   To learn more about what you can do with MyChart, go to [email or web address].    Your next appointment:   1 month(s)  The format for your next appointment:   In Person  Provider:   Dr. Debara Pickett or Dr. Curt Bears  Other Instructions   Important Information About Sugar

## 2022-06-02 ENCOUNTER — Encounter: Payer: Self-pay | Admitting: *Deleted

## 2022-06-07 ENCOUNTER — Encounter: Payer: BC Managed Care – PPO | Admitting: Family Medicine

## 2022-06-10 ENCOUNTER — Encounter: Payer: Self-pay | Admitting: Family Medicine

## 2022-06-10 ENCOUNTER — Ambulatory Visit (INDEPENDENT_AMBULATORY_CARE_PROVIDER_SITE_OTHER): Payer: 59 | Admitting: Family Medicine

## 2022-06-10 VITALS — BP 100/70 | HR 99 | Temp 98.3°F | Ht 72.0 in | Wt 185.4 lb

## 2022-06-10 DIAGNOSIS — Z125 Encounter for screening for malignant neoplasm of prostate: Secondary | ICD-10-CM

## 2022-06-10 DIAGNOSIS — Z0000 Encounter for general adult medical examination without abnormal findings: Secondary | ICD-10-CM | POA: Diagnosis not present

## 2022-06-10 DIAGNOSIS — E785 Hyperlipidemia, unspecified: Secondary | ICD-10-CM | POA: Diagnosis not present

## 2022-06-10 DIAGNOSIS — Z23 Encounter for immunization: Secondary | ICD-10-CM

## 2022-06-10 DIAGNOSIS — R6882 Decreased libido: Secondary | ICD-10-CM | POA: Diagnosis not present

## 2022-06-10 DIAGNOSIS — M79601 Pain in right arm: Secondary | ICD-10-CM

## 2022-06-10 MED ORDER — SILDENAFIL CITRATE 100 MG PO TABS: 100.0000 mg | ORAL_TABLET | Freq: Every day | ORAL | 5 refills | Status: AC | PRN

## 2022-06-10 NOTE — Addendum Note (Signed)
Addended by: Doran Clay A on: 06/10/2022 10:30 AM   Modules accepted: Orders

## 2022-06-10 NOTE — Progress Notes (Signed)
Phone: [phone]   Subjective:  Patient presents today for their annual physical. Chief complaint-noted.   See problem oriented charting- ROS- full  review of systems was completed and negative  except for: low libido, right arm pain. no chest pain, no shortness of breath   The following were reviewed and entered/updated in epic: Past Medical History:  Diagnosis Date   Allergy    Atrial fibrillation ([place])    Basal cell carcinoma of skin    Hyperlipidemia    no meds   Left groin hernia    actually improved with exercise   Patient Active Problem List   Diagnosis Date Noted   Paroxysmal atrial fibrillation ([place]) 03/12/2021    Priority: High   History of skin cancer 10/31/2017    Priority: Low   Sinus node dysfunction ([place]) 09/06/2014    Priority: Low   Hyperlipidemia 09/06/2014    Priority: Low   Left groin hernia 01/17/2014    Priority: Low   Past Surgical History:  Procedure Laterality Date   ATRIAL FIBRILLATION ABLATION N/A 10/15/2021   Procedure: ATRIAL FIBRILLATION ABLATION;  Surgeon: Constance Haw, MD;  Location: [facility];  Service: Cardiovascular;  Laterality: N/A;   BASAL CELL CARCINOMA EXCISION     WISDOM TOOTH EXTRACTION      Family History  Problem Relation Age of Onset   Hypothyroidism Mother        also dementia   Diabetes Mother    Stroke Father    Depression Sister    Diabetes Brother    Schizophrenia Brother    Kidney disease Brother    Diabetes Maternal Grandmother    Dementia Maternal Grandfather    Heart attack Paternal Grandmother    Lung cancer Paternal Grandfather    Colon cancer Neg Hx    Colon polyps Neg Hx    Esophageal cancer Neg Hx    Rectal cancer Neg Hx    Stomach cancer Neg Hx     Medications- reviewed and updated Current Outpatient Medications  Medication Sig Dispense Refill   busPIRone (BUSPAR) 5 MG tablet Take 1 tablet (5 mg total) by mouth 2 (two) times daily as needed (anxiety with travel). (Patient  taking differently: Take 5 mg by mouth as needed (anxiety with travel).) 10 tablet 0   carvedilol (COREG) 6.25 MG tablet Take 1 tablet (6.25 mg total) by mouth 2 (two) times daily. 180 tablet 3   psyllium (METAMUCIL) 58.6 % powder Take 1 packet by mouth at bedtime.     Red Yeast Rice Extract (RED YEAST RICE PO) Take 2 capsules by mouth 2 (two) times daily.     sildenafil (VIAGRA) 100 MG tablet Take 1 tablet (100 mg total) by mouth daily as needed for erectile dysfunction. 10 tablet 5   No current facility-administered medications for this visit.    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 Hoping to retire around 69   [employer] background      Hobbies: exercising   Objective  Objective:  BP 100/70   Pulse 99   Temp 98.3 F (36.8 C)   Ht 6' (1.829 m)   Wt 185 lb 6.4 oz (84.1 kg)   SpO2 99%   BMI  25.14 kg/m  Gen: NAD, resting comfortably HEENT: Mucous membranes are moist. Oropharynx normal Neck: no thyromegaly CV: RRR no murmurs rubs or gallops Lungs: CTAB no crackles, wheeze, rhonchi Abdomen: soft/nontender/nondistended/normal bowel sounds. No rebound or guarding.  Ext: no edema Skin: warm, dry Neuro: grossly normal, moves all extremities, PERRLA   Assessment and Plan  51 y.o. male presenting for annual physical.  Health Maintenance counseling: 1. Anticipatory guidance: Patient counseled regarding regular dental exams -q6 months, eye exams -yearly,  avoiding smoking and second hand smoke , limiting alcohol to 2 beverages per day - 0-7 per week, no illicit drugs .   2. Risk factor reduction:  Advised patient of need for regular exercise and diet rich and fruits and vegetables to reduce risk of heart attack and stroke.  Exercise- see  below- needs to improve this goal 150 minutes a week.  Diet/weight management-up 6 lbs from last year- plans to make dietary changes- wants to get back to 174- feels eats reasonably healthy but needs to increase activity.  Wt Readings from Last 3 Encounters:  06/10/22 185 lb 6.4 oz (84.1 kg)  05/06/22 186 lb 6.4 oz (84.6 kg)  01/17/22 183 lb 12.8 oz (83.4 kg)  3. Immunizations/screenings/ancillary studies- flu shot today, covid shot - opts out, shingrix #1 today Immunization History  Administered Date(s) Administered   Influenza Split 05/31/2011   Influenza Whole 04/02/2008, 04/22/2009   Influenza,inj,Quad PF,6+ Mos 05/27/2019, 06/10/2022   Influenza-Unspecified 04/20/2017, 04/23/2018, 05/01/2020, 05/20/2021   Moderna Sars-Covid-2 Vaccination 06/24/2019, 07/22/2019, 05/22/2020   Td 06/20/1996, 02/09/2007   Tdap 05/17/2017   Zoster Recombinat (Shingrix) 06/10/2022  4. Prostate cancer screening- typically wouldn't check but with low libido in case low testosterone and need to start therapy- will get baseline psa  5. Colon cancer screening - 04/16/21 with 10 year repeat colonoscopy.  6. Skin cancer screening- derm Dr. Wilhemina Bonito. advised regular sunscreen use. Denies worrisome, changing, or new skin lesions.  7. Smoking associated screening (lung cancer screening, AAA screen 65-75, UA)- never smoker 8. STD screening - not needed- only active with wfie  Status of chronic or acute concerns   #social history- stress way better with new job!   # Paroxysmal Atrial fibrillation- s/p ablation 10/15/21- follows with DR. Hochrein S: Rate controlled with  carvedilol 6.25 mg BID  but not in afib(tachycardia non a fib related even after procedure) Anticoagulated with  no rx  A/P: doing well under cardiology care- no obvious recurrence and does monitor with apple watch   #hyperlipidemia- CAC score of 0 on 07/15/21 with Ct cardiac morphologly S: Medication: red yeast rice Lab Results  Component Value  Date   CHOL 216 (H) 06/03/2021   HDL 70.20 06/03/2021   LDLCALC 136 (H) 06/03/2021   TRIG 49.0 06/03/2021   CHOLHDL 3 06/03/2021   A/P: hopefully improved but weight up some and wanting to improve exercise- update lipid panel today. Continue without meds for now  # Stress/anxiety S:medication: buspirone 5 mg twice daily  prn  A/P: only needing with flight- now- work stress much better   # Right arm pain S:4-5 months ago started having difficulty raising right arm laterally- pain grabs him bottom of deltoid. Has not bene working out regularly. Wakes up hurting in the morning sometime.   A/P: unable to reach behind, across body, or beyond 90 degrees for abduction of shoulder- concern rotator cuff involved- refer to sports medicine today   #Low libido- has not improved even with less stress -  slightly better function off metoprolol and then slightly worse on coreg. Started exercising in October walking 4 miles a day and no change in drive then went back down. Does not desire to masturbate- does not look at pron.  - was getting morning erections off metoprolol but now gone again  Recommended follow up: Return in about 1 year (around 06/11/2023) for physical or sooner if needed.Schedule b4 you leave. Future Appointments  Date Time Provider [place]  07/21/2022  8:30 AM Fenton, Clint R, PA [id number] None   Lab/Order associations: fasting   ICD-10-CM   1. Preventative health care  Z00.00 CBC with Differential/Platelet    Comprehensive metabolic panel    Lipid panel    PSA    Testosterone    TSH    Cortisol    2. Need for shingles vaccine  Z23 Zoster Recombinant (Shingrix )    3. Need for immunization against influenza  Z23 Flu Vaccine QUAD 6+ mos PF IM (Fluarix Quad PF)    4. Hyperlipidemia, unspecified hyperlipidemia type  E78.5 CBC with Differential/Platelet    Comprehensive metabolic panel    Lipid panel    TSH    5. Low libido  R68.82 Testosterone    Cortisol    6.  Screening for prostate cancer  Z12.5 PSA    7. Right arm pain  M79.601 Ambulatory referral to Sports Medicine     Meds ordered this encounter  Medications   sildenafil (VIAGRA) 100 MG tablet    Sig: Take 1 tablet (100 mg total) by mouth daily as needed for erectile dysfunction.    Dispense:  10 tablet    Refill:  5    Return precautions advised.  Garret Reddish, MD

## 2022-06-10 NOTE — Patient Instructions (Addendum)
Flu shot today and shingrix  Schedule nurse visit 2-6 months for final shingrix  We will call you within two weeks about your referral to [facility]. If you do not hear within 2 weeks, give them a call with # listed on after visit summary  Trial only half tablet sildenafil 30 minutes or so before sex. If doesn't work but no side effects can use full tablet next attempt.   Please stop by lab before you go If you have mychart- we will send your results within 3 business days of [place] receiving them.  If you do not have mychart- we will call you about results within 5 business days of [place] receiving them.  *please also note that you will see labs on mychart as soon as they post. I will later go in and write notes on them- will say "notes from Dr. Yong Channel"   Recommended follow up: Return in about 1 year (around 06/11/2023) for physical or sooner if needed.Schedule b4 you leave.

## 2022-06-14 ENCOUNTER — Other Ambulatory Visit (INDEPENDENT_AMBULATORY_CARE_PROVIDER_SITE_OTHER): Payer: 59

## 2022-06-14 DIAGNOSIS — E785 Hyperlipidemia, unspecified: Secondary | ICD-10-CM | POA: Diagnosis not present

## 2022-06-14 DIAGNOSIS — Z0000 Encounter for general adult medical examination without abnormal findings: Secondary | ICD-10-CM

## 2022-06-14 DIAGNOSIS — R6882 Decreased libido: Secondary | ICD-10-CM

## 2022-06-14 DIAGNOSIS — Z125 Encounter for screening for malignant neoplasm of prostate: Secondary | ICD-10-CM

## 2022-06-14 LAB — LIPID PANEL
Cholesterol: 173 mg/dL (ref 0–200)
HDL: 47.6 mg/dL (ref >39.00)
LDL Cholesterol: 106 mg/dL — ABNORMAL HIGH (ref 0–99)
NonHDL: 125.05
Total CHOL/HDL Ratio: 4
Triglycerides: 94 mg/dL (ref 0.0–149.0)
VLDL: 18.8 mg/dL (ref 0.0–40.0)

## 2022-06-14 LAB — CBC WITH DIFFERENTIAL/PLATELET
Basophils Absolute: 0 10*3/uL (ref 0.0–0.1)
Basophils Relative: 0.4 % (ref 0.0–3.0)
Eosinophils Absolute: 0.2 10*3/uL (ref 0.0–0.7)
Eosinophils Relative: 3.8 % (ref 0.0–5.0)
HCT: 40 % (ref 39.0–52.0)
Hemoglobin: 13.6 g/dL (ref 13.0–17.0)
Lymphocytes Relative: 32.7 % (ref 12.0–46.0)
Lymphs Abs: 1.4 10*3/uL (ref 0.7–4.0)
MCHC: 34 g/dL (ref 30.0–36.0)
MCV: 85.4 fl (ref 78.0–100.0)
Monocytes Absolute: 0.4 10*3/uL (ref 0.1–1.0)
Monocytes Relative: 9.4 % (ref 3.0–12.0)
Neutro Abs: 2.3 10*3/uL (ref 1.4–7.7)
Neutrophils Relative %: 53.7 % (ref 43.0–77.0)
Platelets: 378 10*3/uL (ref 150.0–400.0)
RBC: 4.69 Mil/uL (ref 4.22–5.81)
RDW: 13.1 % (ref 11.5–15.5)
WBC: 4.2 10*3/uL (ref 4.0–10.5)

## 2022-06-14 LAB — COMPREHENSIVE METABOLIC PANEL
ALT: 17 U/L (ref 0–53)
AST: 17 U/L (ref 0–37)
Albumin: 4 g/dL (ref 3.5–5.2)
Alkaline Phosphatase: 35 U/L — ABNORMAL LOW (ref 39–117)
BUN: 11 mg/dL (ref 6–23)
CO2: 29 mEq/L (ref 19–32)
Calcium: 9 mg/dL (ref 8.4–10.5)
Chloride: 104 mEq/L (ref 96–112)
Creatinine, Ser: 0.91 mg/dL (ref 0.40–1.50)
GFR: 97.45 mL/min (ref >60.00)
Glucose, Bld: 102 mg/dL — ABNORMAL HIGH (ref 70–99)
Potassium: 4.6 mEq/L (ref 3.5–5.1)
Sodium: 140 mEq/L (ref 135–145)
Total Bilirubin: 0.3 mg/dL (ref 0.2–1.2)
Total Protein: 6.7 g/dL (ref 6.0–8.3)

## 2022-06-14 LAB — TSH: TSH: 0.99 u[IU]/mL (ref 0.35–5.50)

## 2022-06-14 LAB — CORTISOL: Cortisol, Plasma: 11.6 ug/dL

## 2022-06-14 LAB — PSA: PSA: 1.3 ng/mL (ref 0.10–4.00)

## 2022-06-14 LAB — TESTOSTERONE: Testosterone: 527.69 ng/dL (ref 300.00–890.00)

## 2022-06-27 NOTE — Progress Notes (Signed)
I, Peterson Lombard, LAT, ATC acting as a scribe for Lynne Leader, MD.  Subjective:    CC: R upper arm pain  HPI: Pt is a 52 y/o male c/o R upper arm pain x 4-5 months. Pt c/o pain waking him up at night. Pt went to go reach for something and felt a pull in his R upper arm. Pt locates pain to the lateral aspect of his R upper arm.  Pain is worse with abduction, reaching back and bedtime. 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  Neck pain: no Radiates: no UE Numbness/tingling: no UE Weakness: no Aggravates: shoulder ext, IR, horz aDd, aBd- esp over 90 Treatments tried: avoid motions/activities that cause pain  Pertinent review of Systems: No fevers or chills  Relevant historical information: Hyperlipidemia.   Objective:    Vitals:   06/28/22 0744  BP: (!) 138/94  Pulse: 98  SpO2: 98%   General: Well Developed, well nourished, and in no acute distress.   MSK: Right shoulder: Normal-appearing Range of motion abduction and forward flexion are intact.  However abduction arc is painful.  Functional internal rotation is limited to lumbar spine.  External rotation very slightly limited lacking the last 10 degrees of external rotation. Strength is intact. Positive Hawkins and Neer's test.  Positive empty can test Negative Yergason's and speeds test. Positive crossover arm compression test. Pulses capillary refill and sensation are intact distally.  Lab and Radiology Results  Diagnostic Limited MSK Ultrasound of: Right shoulder Biceps tendon is intact. Subscapularis tendon intact normal. Supraspinatus tendon is intact without visible retracted rotator cuff tear. Mild subacromial bursitis is present. Infraspinatus tendon intact normal. AC joint mild degenerative appearing. Impression: Subacromial bursitis.  X-ray images right shoulder obtained today personally and independently interpreted No acute  fractures.  Mild AC DJD. Await formal radiology review   Impression and Recommendations:    Assessment and Plan: 51 y.o. male with right shoulder pain.  Right shoulder pain.  This is now a somewhat chronic issue.  Pain thought to be due to subacromial impingement and bursitis with mild rotator cuff tendinitis.  Plan to treat with physical therapy.  Check back in about 6 weeks.Marland Kitchen  PDMP not reviewed this encounter. Orders Placed This Encounter  Procedures   [place] LIMITED JOINT SPACE STRUCTURES UP RIGHT(NO LINKED CHARGES)    Order Specific Question:   Reason for Exam (SYMPTOM  OR DIAGNOSIS REQUIRED)    Answer:   right shoulder pain    Order Specific Question:   Preferred imaging location?    Answer:   [facility]   DG Shoulder Right    Standing Status:   Future    Number of Occurrences:   1    Standing Expiration Date:   06/28/2023    Order Specific Question:   Reason for Exam (SYMPTOM  OR DIAGNOSIS REQUIRED)    Answer:   right shoulder pain    Order Specific Question:   Preferred imaging location?    Answer:   [facility]   Ambulatory referral to Physical Therapy    Referral Priority:   Routine    Referral Type:   Physical Medicine    Referral Reason:   Specialty Services Required    Requested Specialty:   Physical Therapy    Number of Visits Requested:   1   No orders of the defined types were placed in this encounter.   Discussed warning  signs or symptoms. Please see discharge instructions. Patient expresses understanding.   The above documentation has been reviewed and is accurate and complete Lynne Leader, M.D.

## 2022-06-28 ENCOUNTER — Ambulatory Visit (INDEPENDENT_AMBULATORY_CARE_PROVIDER_SITE_OTHER): Payer: 59

## 2022-06-28 ENCOUNTER — Ambulatory Visit: Payer: Self-pay

## 2022-06-28 ENCOUNTER — Ambulatory Visit: Payer: 59 | Admitting: Family Medicine

## 2022-06-28 VITALS — BP 138/94 | HR 98 | Ht 72.0 in | Wt 190.0 lb

## 2022-06-28 DIAGNOSIS — M25511 Pain in right shoulder: Secondary | ICD-10-CM

## 2022-06-28 DIAGNOSIS — G8929 Other chronic pain: Secondary | ICD-10-CM

## 2022-06-28 NOTE — Patient Instructions (Addendum)
Thank you for coming in today.   Please get an Xray today before you leave   I've referred you to Physical Therapy.  Let us know if you don't hear from them in one week.   Recheck in about 6 weeks.   Let me know if you have a problem.

## 2022-06-29 ENCOUNTER — Ambulatory Visit: Payer: 59 | Admitting: Physical Therapy

## 2022-06-29 DIAGNOSIS — M25511 Pain in right shoulder: Secondary | ICD-10-CM | POA: Diagnosis not present

## 2022-06-29 DIAGNOSIS — G8929 Other chronic pain: Secondary | ICD-10-CM

## 2022-06-29 NOTE — Therapy (Signed)
OUTPATIENT PHYSICAL THERAPY UPPER EXTREMITY EVALUATION   Patient Name: Casey Zavala MRN: [id number] DOB:1970/09/10, 52 y.o., male Today's Date: 06/29/2022  END OF SESSION:  PT End of Session - 07/03/22 2020     Visit Number 1    Number of Visits 16    Date for PT Re-Evaluation 08/24/22    Authorization Type [employer]    PT Start Time 0845    PT Stop Time 0928    PT Time Calculation (min) 43 min    Activity Tolerance Patient tolerated treatment well    Behavior During Therapy WFL for tasks assessed/performed             Past Medical History:  Diagnosis Date   Allergy    Atrial fibrillation ([place])    Basal cell carcinoma of skin    Hyperlipidemia    no meds   Left groin hernia    actually improved with exercise   Past Surgical History:  Procedure Laterality Date   ATRIAL FIBRILLATION ABLATION N/A 10/15/2021   Procedure: ATRIAL FIBRILLATION ABLATION;  Surgeon: Constance Haw, MD;  Location: [facility];  Service: Cardiovascular;  Laterality: N/A;   BASAL CELL CARCINOMA EXCISION     WISDOM TOOTH EXTRACTION     Patient Active Problem List   Diagnosis Date Noted   Paroxysmal atrial fibrillation ([place]) 03/12/2021   History of skin cancer 10/31/2017   Sinus node dysfunction ([place]) 09/06/2014   Hyperlipidemia 09/06/2014   Left groin hernia 01/17/2014    PCP: Garret Reddish  REFERRING PROVIDER: Lynne Leader  REFERRING DIAG: Lynne Leader  THERAPY DIAG:  Chronic right shoulder pain  Rationale for Evaluation and Treatment: Rehabilitation  ONSET DATE:   SUBJECTIVE:                                                                                                                                                                                      SUBJECTIVE STATEMENT:  Pt with onset of R shoulder pain about 4 months ago. States pain in arm, when Lifting out to the side and across body. He is R handed. He sits for work, retirement community, Press photographer. Computer  work. States No pain at work.  Would like to start walking more, does not like to exercise.   PERTINENT HISTORY:  A fib   PAIN:  Are you having pain? Yes: NPRS scale: 6-7 /10 Pain location: R shoulder/arm Pain description: Sore, intermittent  Aggravating factors: lifting out to sid,across body  Relieving factors: rest  PRECAUTIONS: None  WEIGHT BEARING RESTRICTIONS: No  FALLS:  Has patient fallen in last 6 months? No  PLOF: Independent  PATIENT GOALS: decreased pain,  increased movement    OBJECTIVE:   DIAGNOSTIC FINDINGS:    PATIENT SURVEYS :  FOTO   66.1   COGNITION: Overall cognitive status: Within functional limits for tasks assessed      POSTURE:   UPPER EXTREMITY ROM:   Active AROM Right eval Left eval  Shoulder flexion 120 155  Shoulder extension    Shoulder abduction 90   Shoulder adduction    Shoulder internal rotation 25   Shoulder external rotation 45   Elbow flexion    Elbow extension    Wrist flexion    Wrist extension    Wrist ulnar deviation    Wrist radial deviation    Wrist pronation    Wrist supination    (Blank rows = not tested)  UPPER EXTREMITY MMT:  MMT Right eval Left eval  Shoulder flexion 3+   Shoulder extension    Shoulder abduction 3-   Shoulder adduction    Shoulder internal rotation 4   Shoulder external rotation 4   Middle trapezius    Lower trapezius    Elbow flexion    Elbow extension    Wrist flexion    Wrist extension    Wrist ulnar deviation    Wrist radial deviation    Wrist pronation    Wrist supination    Grip strength (lbs)    (Blank rows = not tested)  SHOULDER SPECIAL TESTS:  JOINT MOBILITY TESTING:  Hypomobile R GHJ, significant limitation for IR,  mod for ER and abd, mild for flexion   PALPATION:     TODAY'S TREATMENT:                                                                                                                                         DATE:  06/29/22:  Ther ex:  See below for HEP  PATIENT EDUCATION: Education details: PT POC, Exam findings, HEP Person educated: Patient Education method: Explanation, Demonstration, Tactile cues, Verbal cues, and Handouts Education comprehension: verbalized understanding, returned demonstration, verbal cues required, tactile cues required, and needs further education  HOME EXERCISE PROGRAM: Access Code: [email or web address] URL: [email or web address] Date: 07/03/2022 Prepared by: Lyndee Hensen  Exercises - Supine Shoulder Flexion Extension AAROM with Dowel  - 2 x daily - 1- 2 sets - 10 reps - 5 hold - Supine Shoulder Flexion AAROM with Hands Clasped  - 1-2 x daily - 1 sets - 10 reps - Supine Shoulder External Rotation with Dowel  - 1-2 x daily - 1-2 sets - 10 reps - 5 hold - Seated Shoulder Flexion Towel Slide at Table Top  - 2 x daily - 1 sets - 10 reps - 5 hold - Standing Shoulder Internal Rotation Stretch with Hands Behind Back  - 2 x daily - 1 sets - 10 reps - 3 hold  ASSESSMENT:  CLINICAL IMPRESSION: Patient presents with primary complaint of increased pain in R  shoulder. He has increased joint stiffness, and lack of full PROM and AROM. He has decreased strength and inability for full functional use of shoulder for lifting, reaching, ADLS and IADLs. Pt to benefit from skilled PT to improve deficits and pain.  OBJECTIVE IMPAIRMENTS: Abnormal gait, decreased activity tolerance, decreased mobility, decreased ROM, decreased strength, hypomobility, increased muscle spasms, impaired UE functional use, and pain.   ACTIVITY LIMITATIONS: carrying, lifting, dressing, reach over head, hygiene/grooming, and locomotion level  PARTICIPATION LIMITATIONS: meal prep, cleaning, laundry, driving, community activity, occupation, and yard work  PERSONAL FACTORS: Time since onset of injury/illness/exacerbation are also affecting patient's functional outcome.   REHAB POTENTIAL: Good  CLINICAL DECISION MAKING:  Stable/uncomplicated  EVALUATION COMPLEXITY: Low  GOALS: Goals reviewed with patient? Yes   SHORT TERM GOALS: Target date: 07/13/2022   Pt to be independent with initial HEP  Goal status: INITIAL  2.  Pt to demo improved PROM for flexion and IR,  by at least 10 deg,  Goal status: INITIAL   LONG TERM GOALS: Target date: 08/24/2022   Pt to be independent with final HEP  Goal status: INITIAL  2.  Pt to demo improved AROM to be WNL to improve ability for ADLs.  Goal status: INITIAL  3.  Pt to demo improved strength of R shoulder to at least 4/5 to improve ability for reach, lift, and IADLs.   Goal status: INITIAL   PLAN: PT FREQUENCY: 1-2x/week  PT DURATION: 8 weeks  PLANNED INTERVENTIONS: Therapeutic exercises, Therapeutic activity, Neuromuscular re-education, Balance training, Gait training, Patient/Family education, Self Care, Joint mobilization, Joint manipulation, Stair training, Aquatic Therapy, Dry Needling, Electrical stimulation, Spinal manipulation, Spinal mobilization, Cryotherapy, Moist heat, Taping, Traction, Ultrasound, Ionotophoresis '4mg'$ /ml Dexamethasone, and Manual therapy  PLAN FOR NEXT SESSION:    Lyndee Hensen, PT, DPT 8:40 PM  07/03/22

## 2022-06-29 NOTE — Progress Notes (Signed)
Right shoulder x-ray looks normal to radiology

## 2022-07-03 ENCOUNTER — Encounter: Payer: Self-pay | Admitting: Physical Therapy

## 2022-07-05 ENCOUNTER — Ambulatory Visit: Payer: 59 | Admitting: Physical Therapy

## 2022-07-05 ENCOUNTER — Encounter: Payer: Self-pay | Admitting: Physical Therapy

## 2022-07-05 DIAGNOSIS — M25511 Pain in right shoulder: Secondary | ICD-10-CM | POA: Diagnosis not present

## 2022-07-05 DIAGNOSIS — G8929 Other chronic pain: Secondary | ICD-10-CM

## 2022-07-05 NOTE — Therapy (Signed)
OUTPATIENT PHYSICAL THERAPY UPPER EXTREMITY TREATMENT   Patient Name: Casey Zavala MRN: [id number] DOB:1971/01/17, 52 y.o., male Today's Date: 07/05/2022  END OF SESSION:  PT End of Session - 07/05/22 1035     Visit Number 2    Number of Visits 16    Date for PT Re-Evaluation 08/24/22    Authorization Type [employer]    PT Start Time 0802    PT Stop Time 0845    PT Time Calculation (min) 43 min    Activity Tolerance Patient tolerated treatment well    Behavior During Therapy WFL for tasks assessed/performed              Past Medical History:  Diagnosis Date   Allergy    Atrial fibrillation ([place])    Basal cell carcinoma of skin    Hyperlipidemia    no meds   Left groin hernia    actually improved with exercise   Past Surgical History:  Procedure Laterality Date   ATRIAL FIBRILLATION ABLATION N/A 10/15/2021   Procedure: ATRIAL FIBRILLATION ABLATION;  Surgeon: Constance Haw, MD;  Location: [facility];  Service: Cardiovascular;  Laterality: N/A;   BASAL CELL CARCINOMA EXCISION     WISDOM TOOTH EXTRACTION     Patient Active Problem List   Diagnosis Date Noted   Paroxysmal atrial fibrillation ([place]) 03/12/2021   History of skin cancer 10/31/2017   Sinus node dysfunction ([place]) 09/06/2014   Hyperlipidemia 09/06/2014   Left groin hernia 01/17/2014    PCP: Garret Reddish  REFERRING PROVIDER: Lynne Leader  REFERRING DIAG: Lynne Leader  THERAPY DIAG:  Chronic right shoulder pain  Rationale for Evaluation and Treatment: Rehabilitation  ONSET DATE:   SUBJECTIVE:                                                                                                                                                                                      SUBJECTIVE STATEMENT: 07/05/2022: no new complaints.    Eval: Pt with onset of R shoulder pain about 4 months ago. States pain in arm, when Lifting out to the side and across body. He is R handed. 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  Would like to start walking more, does not like to exercise.   PERTINENT HISTORY:  A fib   PAIN:  Are you having pain? Yes: NPRS scale: 6-7 /10 Pain location: R shoulder/arm Pain description: Sore, intermittent  Aggravating factors: lifting out to sid,across body  Relieving factors: rest  PRECAUTIONS: None  WEIGHT BEARING RESTRICTIONS: No  FALLS:  Has patient fallen in last 6 months? No  PLOF: Independent  PATIENT GOALS: decreased pain, increased movement    OBJECTIVE:   DIAGNOSTIC FINDINGS:    PATIENT SURVEYS :  FOTO   66.1   COGNITION: Overall cognitive status: Within functional limits for tasks assessed      POSTURE:   UPPER EXTREMITY ROM:   Active AROM Right eval Left eval  Shoulder flexion 120 155  Shoulder extension    Shoulder abduction 90   Shoulder adduction    Shoulder internal rotation 25   Shoulder external rotation 45   Elbow flexion    Elbow extension    Wrist flexion    Wrist extension    Wrist ulnar deviation    Wrist radial deviation    Wrist pronation    Wrist supination    (Blank rows = not tested)  UPPER EXTREMITY MMT:  MMT Right eval Left eval  Shoulder flexion 3+   Shoulder extension    Shoulder abduction 3-   Shoulder adduction    Shoulder internal rotation 4   Shoulder external rotation 4   Middle trapezius    Lower trapezius    Elbow flexion    Elbow extension    Wrist flexion    Wrist extension    Wrist ulnar deviation    Wrist radial deviation    Wrist pronation    Wrist supination    Grip strength (lbs)    (Blank rows = not tested)  SHOULDER SPECIAL TESTS:  JOINT MOBILITY TESTING:  Hypomobile R GHJ, significant limitation for IR,  mod for ER and abd, mild for flexion   PALPATION:     TODAY'S TREATMENT:                                                                                                                                          DATE:   07/05/22: Therapeutic Exercise: Aerobic: Supine:  Shoulder flexion AAROM/Cane x 15; ER butterfly x 15; IR/ER cane AAROM x 20;  Seated: pulley/flexion x 3 min;  Standing:  Wall slides x 15 on R;  Rows GTB x 25;   Stretches:  IR behind back with stick x 3 min; Flexion stretch at rail x 2 min;  Neuromuscular Re-education: Manual Therapy: PROM R shoulder all motions, Inf and post mobs; LAD;    PATIENT EDUCATION: Education details: Reviewed and updated HEP Person educated: Patient Education method: Explanation, Demonstration, Tactile cues, Verbal cues, and Handouts Education comprehension: verbalized understanding, returned demonstration, verbal cues required, tactile cues required, and needs further education  HOME EXERCISE PROGRAM: Access Code: [email or web address]   ASSESSMENT:  CLINICAL IMPRESSION: 07/05/2022 Pt with good tolerance for activities today. Focus on shoulder ROM today. He is limited by pain for most motions at end range. Much limitation for IR. Plan to progress as tolerated. Reviewed importance of HEP.   Eval: Patient presents with primary complaint of increased pain in R shoulder. He has increased  joint stiffness, and lack of full PROM and AROM. He has decreased strength and inability for full functional use of shoulder for lifting, reaching, ADLS and IADLs. Pt to benefit from skilled PT to improve deficits and pain.  OBJECTIVE IMPAIRMENTS: Abnormal gait, decreased activity tolerance, decreased mobility, decreased ROM, decreased strength, hypomobility, increased muscle spasms, impaired UE functional use, and pain.   ACTIVITY LIMITATIONS: carrying, lifting, dressing, reach over head, hygiene/grooming, and locomotion level  PARTICIPATION LIMITATIONS: meal prep, cleaning, laundry, driving, community activity, occupation, and yard work  PERSONAL FACTORS: Time since onset of injury/illness/exacerbation are also affecting patient's functional outcome.   REHAB  POTENTIAL: Good  CLINICAL DECISION MAKING: Stable/uncomplicated  EVALUATION COMPLEXITY: Low  GOALS: Goals reviewed with patient? Yes   SHORT TERM GOALS: Target date: 07/13/2022   Pt to be independent with initial HEP  Goal status: INITIAL  2.  Pt to demo improved PROM for flexion and IR,  by at least 10 deg,  Goal status: INITIAL   LONG TERM GOALS: Target date: 08/24/2022   Pt to be independent with final HEP  Goal status: INITIAL  2.  Pt to demo improved AROM to be WNL to improve ability for ADLs.  Goal status: INITIAL  3.  Pt to demo improved strength of R shoulder to at least 4/5 to improve ability for reach, lift, and IADLs.   Goal status: INITIAL   PLAN: PT FREQUENCY: 1-2x/week  PT DURATION: 8 weeks  PLANNED INTERVENTIONS: Therapeutic exercises, Therapeutic activity, Neuromuscular re-education, Balance training, Gait training, Patient/Family education, Self Care, Joint mobilization, Joint manipulation, Stair training, Aquatic Therapy, Dry Needling, Electrical stimulation, Spinal manipulation, Spinal mobilization, Cryotherapy, Moist heat, Taping, Traction, Ultrasound, Ionotophoresis '4mg'$ /ml Dexamethasone, and Manual therapy  PLAN FOR NEXT SESSION:    Lyndee Hensen, PT, DPT 10:37 AM  07/05/22

## 2022-07-07 ENCOUNTER — Ambulatory Visit: Payer: 59 | Admitting: Physical Therapy

## 2022-07-07 ENCOUNTER — Encounter: Payer: Self-pay | Admitting: Physical Therapy

## 2022-07-07 DIAGNOSIS — G8929 Other chronic pain: Secondary | ICD-10-CM | POA: Diagnosis not present

## 2022-07-07 DIAGNOSIS — M25511 Pain in right shoulder: Secondary | ICD-10-CM | POA: Diagnosis not present

## 2022-07-07 NOTE — Therapy (Signed)
OUTPATIENT PHYSICAL THERAPY UPPER EXTREMITY TREATMENT   Patient Name: Casey Zavala MRN: [id number] DOB:[birth year], 52 y.o., male Today's Date: 07/07/2022  END OF SESSION:  PT End of Session - 07/07/22 0808     Visit Number 3    Number of Visits 16    Date for PT Re-Evaluation 08/24/22    Authorization Type [employer]    PT Start Time 0804    PT Stop Time 0845    PT Time Calculation (min) 41 min    Activity Tolerance Patient tolerated treatment well    Behavior During Therapy WFL for tasks assessed/performed               Past Medical History:  Diagnosis Date   Allergy    Atrial fibrillation ([place])    Basal cell carcinoma of skin    Hyperlipidemia    no meds   Left groin hernia    actually improved with exercise   Past Surgical History:  Procedure Laterality Date   ATRIAL FIBRILLATION ABLATION N/A 10/15/2021   Procedure: ATRIAL FIBRILLATION ABLATION;  Surgeon: Constance Haw, MD;  Location: [facility];  Service: Cardiovascular;  Laterality: N/A;   BASAL CELL CARCINOMA EXCISION     WISDOM TOOTH EXTRACTION     Patient Active Problem List   Diagnosis Date Noted   Paroxysmal atrial fibrillation ([place]) 03/12/2021   History of skin cancer 10/31/2017   Sinus node dysfunction ([place]) 09/06/2014   Hyperlipidemia 09/06/2014   Left groin hernia 01/17/2014    PCP: Garret Reddish  REFERRING PROVIDER: Lynne Leader  REFERRING DIAG: Lynne Leader  THERAPY DIAG:  Chronic right shoulder pain  Rationale for Evaluation and Treatment: Rehabilitation  ONSET DATE:   SUBJECTIVE:                                                                                                                                                                                      SUBJECTIVE STATEMENT: 07/07/2022: has been doing HEP.   Eval: Pt with onset of R shoulder pain about 4 months ago. States pain in arm, when Lifting out to the side and across body. He is R handed. 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  Would like to start walking more, does not like to exercise.   PERTINENT HISTORY:  A fib   PAIN:  Are you having pain? Yes: NPRS scale: 6-7 /10 Pain location: R shoulder/arm Pain description: Sore, intermittent  Aggravating factors: lifting out to sid,across body  Relieving factors: rest  PRECAUTIONS: None  WEIGHT BEARING RESTRICTIONS: No  FALLS:  Has patient fallen in last 6 months?  No  PLOF: Independent  PATIENT GOALS: decreased pain, increased movement    OBJECTIVE:   DIAGNOSTIC FINDINGS:    PATIENT SURVEYS :  FOTO   66.1   COGNITION: Overall cognitive status: Within functional limits for tasks assessed      POSTURE:   UPPER EXTREMITY ROM:   Active AROM Right eval Left eval  Shoulder flexion 120 155  Shoulder extension    Shoulder abduction 90   Shoulder adduction    Shoulder internal rotation 25   Shoulder external rotation 45   Elbow flexion    Elbow extension    Wrist flexion    Wrist extension    Wrist ulnar deviation    Wrist radial deviation    Wrist pronation    Wrist supination    (Blank rows = not tested)  UPPER EXTREMITY MMT:  MMT Right eval Left eval  Shoulder flexion 3+   Shoulder extension    Shoulder abduction 3-   Shoulder adduction    Shoulder internal rotation 4   Shoulder external rotation 4   Middle trapezius    Lower trapezius    Elbow flexion    Elbow extension    Wrist flexion    Wrist extension    Wrist ulnar deviation    Wrist radial deviation    Wrist pronation    Wrist supination    Grip strength (lbs)    (Blank rows = not tested)  SHOULDER SPECIAL TESTS:  JOINT MOBILITY TESTING:  Hypomobile R GHJ, significant limitation for IR,  mod for ER and abd, mild for flexion   PALPATION:     TODAY'S TREATMENT:                                                                                                                                          DATE:   07/07/22: Therapeutic Exercise: Aerobic: Supine:  Shoulder flexion AAROM/Cane x 15;  Seated: pulley/flexion and abduction x 3 min;  S/L : ER 2 lb x 15;  Standing:  Wall slides x 15 on R;   Rows GTB x 25;  Shoulder ER 2 x 10 RTB:  Stretches:  IR behind back with stick x 3 min;  Neuromuscular Re-education: Manual Therapy: PROM R shoulder all motions, Inf and post mobs; LAD;  Mulligan mobs for IR in standing with strap x 2 min; contract relax for IR;    07/05/22: Therapeutic Exercise: Aerobic: Supine:  Shoulder flexion AAROM/Cane x 15; ER butterfly x 15; IR/ER cane AAROM x 20;  Seated: pulley/flexion x 3 min;  Standing:  Wall slides x 15 on R;  Rows GTB x 25;   Stretches:  IR behind back with stick x 3 min; Flexion stretch at rail x 2 min;  Neuromuscular Re-education: Manual Therapy: PROM R shoulder all motions, Inf and post mobs; LAD;    PATIENT EDUCATION: Education details: Reviewed and updated HEP  Person educated: Patient Education method: Explanation, Demonstration, Tactile cues, Verbal cues, and Handouts Education comprehension: verbalized understanding, returned demonstration, verbal cues required, tactile cues required, and needs further education  HOME EXERCISE PROGRAM: Access Code: [email or web address]   ASSESSMENT:  CLINICAL IMPRESSION: 07/07/2022 Pt with mild improvements for passive IR today. Mild improvent in soreness at end ranges as well. Good ability for ER strength today.   Eval: Patient presents with primary complaint of increased pain in R shoulder. He has increased joint stiffness, and lack of full PROM and AROM. He has decreased strength and inability for full functional use of shoulder for lifting, reaching, ADLS and IADLs. Pt to benefit from skilled PT to improve deficits and pain.  OBJECTIVE IMPAIRMENTS: Abnormal gait, decreased activity tolerance, decreased mobility, decreased ROM, decreased strength, hypomobility, increased muscle spasms,  impaired UE functional use, and pain.   ACTIVITY LIMITATIONS: carrying, lifting, dressing, reach over head, hygiene/grooming, and locomotion level  PARTICIPATION LIMITATIONS: meal prep, cleaning, laundry, driving, community activity, occupation, and yard work  PERSONAL FACTORS: Time since onset of injury/illness/exacerbation are also affecting patient's functional outcome.   REHAB POTENTIAL: Good  CLINICAL DECISION MAKING: Stable/uncomplicated  EVALUATION COMPLEXITY: Low  GOALS: Goals reviewed with patient? Yes   SHORT TERM GOALS: Target date: 07/13/2022   Pt to be independent with initial HEP  Goal status: INITIAL  2.  Pt to demo improved PROM for flexion and IR,  by at least 10 deg,  Goal status: INITIAL   LONG TERM GOALS: Target date: 08/24/2022   Pt to be independent with final HEP  Goal status: INITIAL  2.  Pt to demo improved AROM to be WNL to improve ability for ADLs.  Goal status: INITIAL  3.  Pt to demo improved strength of R shoulder to at least 4/5 to improve ability for reach, lift, and IADLs.   Goal status: INITIAL   PLAN: PT FREQUENCY: 1-2x/week  PT DURATION: 8 weeks  PLANNED INTERVENTIONS: Therapeutic exercises, Therapeutic activity, Neuromuscular re-education, Balance training, Gait training, Patient/Family education, Self Care, Joint mobilization, Joint manipulation, Stair training, Aquatic Therapy, Dry Needling, Electrical stimulation, Spinal manipulation, Spinal mobilization, Cryotherapy, Moist heat, Taping, Traction, Ultrasound, Ionotophoresis '4mg'$ /ml Dexamethasone, and Manual therapy  PLAN FOR NEXT SESSION:    Lyndee Hensen, PT, DPT 8:08 AM  07/07/22

## 2022-07-11 ENCOUNTER — Ambulatory Visit: Payer: 59 | Admitting: Physical Therapy

## 2022-07-11 ENCOUNTER — Encounter: Payer: Self-pay | Admitting: Physical Therapy

## 2022-07-11 DIAGNOSIS — M25511 Pain in right shoulder: Secondary | ICD-10-CM

## 2022-07-11 DIAGNOSIS — G8929 Other chronic pain: Secondary | ICD-10-CM

## 2022-07-11 NOTE — Therapy (Signed)
OUTPATIENT PHYSICAL THERAPY UPPER EXTREMITY TREATMENT   Patient Name: Casey Zavala MRN: [id number] DOB:1970/12/22, 52 y.o., male Today's Date: 07/11/2022  END OF SESSION:  PT End of Session - 07/11/22 0759     Visit Number 4    Number of Visits 16    Date for PT Re-Evaluation 08/24/22    Authorization Type [employer]    PT Start Time 0800    PT Stop Time 0847    PT Time Calculation (min) 47 min    Activity Tolerance Patient tolerated treatment well    Behavior During Therapy WFL for tasks assessed/performed               Past Medical History:  Diagnosis Date   Allergy    Atrial fibrillation ([place])    Basal cell carcinoma of skin    Hyperlipidemia    no meds   Left groin hernia    actually improved with exercise   Past Surgical History:  Procedure Laterality Date   ATRIAL FIBRILLATION ABLATION N/A 10/15/2021   Procedure: ATRIAL FIBRILLATION ABLATION;  Surgeon: Constance Haw, MD;  Location: [facility];  Service: Cardiovascular;  Laterality: N/A;   BASAL CELL CARCINOMA EXCISION     WISDOM TOOTH EXTRACTION     Patient Active Problem List   Diagnosis Date Noted   Paroxysmal atrial fibrillation ([place]) 03/12/2021   History of skin cancer 10/31/2017   Sinus node dysfunction ([place]) 09/06/2014   Hyperlipidemia 09/06/2014   Left groin hernia 01/17/2014    PCP: Garret Reddish  REFERRING PROVIDER: Lynne Leader  REFERRING DIAG: Lynne Leader  THERAPY DIAG:  Chronic right shoulder pain  Rationale for Evaluation and Treatment: Rehabilitation  ONSET DATE:   SUBJECTIVE:                                                                                                                                                                                      SUBJECTIVE STATEMENT: 07/11/2022: no new complaints.    Eval: Pt with onset of R shoulder pain about 4 months ago. States pain in arm, when Lifting out to the side and across body. He is R handed. 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  Would like to start walking more, does not like to exercise.   PERTINENT HISTORY:  A fib   PAIN:  Are you having pain? Yes: NPRS scale: 6-7 /10 Pain location: R shoulder/arm Pain description: Sore, intermittent  Aggravating factors: lifting out to sid,across body  Relieving factors: rest  PRECAUTIONS: None  WEIGHT BEARING RESTRICTIONS: No  FALLS:  Has patient fallen in last 6 months?  No  PLOF: Independent  PATIENT GOALS: decreased pain, increased movement    OBJECTIVE:   DIAGNOSTIC FINDINGS:    PATIENT SURVEYS :  FOTO   66.1   COGNITION: Overall cognitive status: Within functional limits for tasks assessed      POSTURE:   UPPER EXTREMITY ROM:   Active AROM Right eval Left eval R 07/11/22  Shoulder flexion 120 155 140  Shoulder extension     Shoulder abduction 90  130  Shoulder adduction     Shoulder internal rotation 25    Shoulder external rotation 45    Elbow flexion     Elbow extension     Wrist flexion     Wrist extension     Wrist ulnar deviation     Wrist radial deviation     Wrist pronation     Wrist supination     (Blank rows = not tested)  UPPER EXTREMITY MMT:  MMT Right eval Left eval  Shoulder flexion 3+   Shoulder extension    Shoulder abduction 3-   Shoulder adduction    Shoulder internal rotation 4   Shoulder external rotation 4   Middle trapezius    Lower trapezius    Elbow flexion    Elbow extension    Wrist flexion    Wrist extension    Wrist ulnar deviation    Wrist radial deviation    Wrist pronation    Wrist supination    Grip strength (lbs)    (Blank rows = not tested)  SHOULDER SPECIAL TESTS:  JOINT MOBILITY TESTING:  Hypomobile R GHJ, significant limitation for IR,  mod for ER and abd, mild for flexion   PALPATION:     TODAY'S TREATMENT:                                                                                                                                          DATE:   07/11/22: Therapeutic Exercise: Aerobic: Supine:  Shoulder flexion AAROM/Cane x 15, 2.5lb ;  Seated: pulley/flexion and abduction x 3 min;  Standing:  Circles at wall for scap stabs 2 x 20   Rows GTB x 25;  Shoulder ER and IR  2 x 10 GTB:  Stretches:  IR behind back with stick x 3 min; ER/chest stretch in doorway 30 sec x 3; self posterior shoulder stretch- supine x 2 min;  Neuromuscular Re-education: Manual Therapy: PROM R shoulder all motions, Inf and post mobs; LAD;     07/07/22: Therapeutic Exercise: Aerobic: Supine:  Shoulder flexion AAROM/Cane x 15;  Seated: pulley/flexion and abduction x 3 min;  S/L : ER 2 lb x 15;  Standing:  Wall slides x 15 on R;   Rows GTB x 25;  Shoulder ER 2 x 10 RTB:  Stretches:  IR behind back with stick x 3 min;  Neuromuscular Re-education: Manual Therapy: PROM R shoulder  all motions, Inf and post mobs; LAD;  Mulligan mobs for IR in standing with strap x 2 min; contract relax for IR;    07/05/22: Therapeutic Exercise: Aerobic: Supine:  Shoulder flexion AAROM/Cane x 15; ER butterfly x 15; IR/ER cane AAROM x 20;  Seated: pulley/flexion x 3 min;  Standing:  Wall slides x 15 on R;  Rows GTB x 25;   Stretches:  IR behind back with stick x 3 min; Flexion stretch at rail x 2 min;  Neuromuscular Re-education: Manual Therapy: PROM R shoulder all motions, Inf and post mobs; LAD;    PATIENT EDUCATION: Education details: Reviewed and updated HEP Person educated: Patient Education method: Explanation, Demonstration, Tactile cues, Verbal cues, and Handouts Education comprehension: verbalized understanding, returned demonstration, verbal cues required, tactile cues required, and needs further education  HOME EXERCISE PROGRAM: Access Code: [email or web address]   ASSESSMENT:  CLINICAL IMPRESSION: 07/11/2022 Pt with improving PROM and AROM with measurments today, as well as improved pain with near full  elevation/flexion. Still has stiffness and pain with IR/ER. Updated HEP to include these today.   Eval: Patient presents with primary complaint of increased pain in R shoulder. He has increased joint stiffness, and lack of full PROM and AROM. He has decreased strength and inability for full functional use of shoulder for lifting, reaching, ADLS and IADLs. Pt to benefit from skilled PT to improve deficits and pain.  OBJECTIVE IMPAIRMENTS: Abnormal gait, decreased activity tolerance, decreased mobility, decreased ROM, decreased strength, hypomobility, increased muscle spasms, impaired UE functional use, and pain.   ACTIVITY LIMITATIONS: carrying, lifting, dressing, reach over head, hygiene/grooming, and locomotion level  PARTICIPATION LIMITATIONS: meal prep, cleaning, laundry, driving, community activity, occupation, and yard work  PERSONAL FACTORS: Time since onset of injury/illness/exacerbation are also affecting patient's functional outcome.   REHAB POTENTIAL: Good  CLINICAL DECISION MAKING: Stable/uncomplicated  EVALUATION COMPLEXITY: Low  GOALS: Goals reviewed with patient? Yes   SHORT TERM GOALS: Target date: 07/13/2022   Pt to be independent with initial HEP  Goal status: INITIAL  2.  Pt to demo improved PROM for flexion and IR,  by at least 10 deg,  Goal status: INITIAL   LONG TERM GOALS: Target date: 08/24/2022   Pt to be independent with final HEP  Goal status: INITIAL  2.  Pt to demo improved AROM to be WNL to improve ability for ADLs.  Goal status: INITIAL  3.  Pt to demo improved strength of R shoulder to at least 4/5 to improve ability for reach, lift, and IADLs.   Goal status: INITIAL   PLAN: PT FREQUENCY: 1-2x/week  PT DURATION: 8 weeks  PLANNED INTERVENTIONS: Therapeutic exercises, Therapeutic activity, Neuromuscular re-education, Balance training, Gait training, Patient/Family education, Self Care, Joint mobilization, Joint manipulation, Stair  training, Aquatic Therapy, Dry Needling, Electrical stimulation, Spinal manipulation, Spinal mobilization, Cryotherapy, Moist heat, Taping, Traction, Ultrasound, Ionotophoresis '4mg'$ /ml Dexamethasone, and Manual therapy  PLAN FOR NEXT SESSION:    Lyndee Hensen, PT, DPT 8:48 AM  07/11/22

## 2022-07-13 ENCOUNTER — Ambulatory Visit: Payer: 59 | Admitting: Physical Therapy

## 2022-07-13 ENCOUNTER — Encounter: Payer: Self-pay | Admitting: Physical Therapy

## 2022-07-13 DIAGNOSIS — G8929 Other chronic pain: Secondary | ICD-10-CM

## 2022-07-13 DIAGNOSIS — M25511 Pain in right shoulder: Secondary | ICD-10-CM | POA: Diagnosis not present

## 2022-07-13 NOTE — Therapy (Signed)
OUTPATIENT PHYSICAL THERAPY UPPER EXTREMITY TREATMENT   Patient Name: Casey Zavala MRN: [id number] DOB:01-Jan-1971, 52 y.o., male Today's Date: 07/13/2022  END OF SESSION:  PT End of Session - 07/13/22 0904     Visit Number 5    Number of Visits 16    Date for PT Re-Evaluation 08/24/22    Authorization Type [employer]    PT Start Time 0807    PT Stop Time 0853    PT Time Calculation (min) 46 min    Activity Tolerance Patient tolerated treatment well    Behavior During Therapy [facility] for tasks assessed/performed                Past Medical History:  Diagnosis Date   Allergy    Atrial fibrillation ([place])    Basal cell carcinoma of skin    Hyperlipidemia    no meds   Left groin hernia    actually improved with exercise   Past Surgical History:  Procedure Laterality Date   ATRIAL FIBRILLATION ABLATION N/A 10/15/2021   Procedure: ATRIAL FIBRILLATION ABLATION;  Surgeon: Constance Haw, MD;  Location: [facility];  Service: Cardiovascular;  Laterality: N/A;   BASAL CELL CARCINOMA EXCISION     WISDOM TOOTH EXTRACTION     Patient Active Problem List   Diagnosis Date Noted   Paroxysmal atrial fibrillation ([place]) 03/12/2021   History of skin cancer 10/31/2017   Sinus node dysfunction ([place]) 09/06/2014   Hyperlipidemia 09/06/2014   Left groin hernia 01/17/2014    PCP: Garret Reddish  REFERRING PROVIDER: Lynne Leader  REFERRING DIAG: Lynne Leader  THERAPY DIAG:  Chronic right shoulder pain  Rationale for Evaluation and Treatment: Rehabilitation  ONSET DATE:   SUBJECTIVE:                                                                                                                                                                                      SUBJECTIVE STATEMENT: 07/13/2022: no new complaints.    Eval: Pt with onset of R shoulder pain about 4 months ago. States pain in arm, when Lifting out to the side and across body. He is R handed. He sits for  work, retirement community, Press photographer. Computer work. States No pain at work.  Would like to start walking more, does not like to exercise.   PERTINENT HISTORY:  A fib   PAIN:  Are you having pain? Yes: NPRS scale: 6-7 /10 Pain location: R shoulder/arm Pain description: Sore, intermittent  Aggravating factors: lifting out to sid,across body  Relieving factors: rest  PRECAUTIONS: None  WEIGHT BEARING RESTRICTIONS: No  FALLS:  Has patient fallen in last 6  months? No  PLOF: Independent  PATIENT GOALS: decreased pain, increased movement    OBJECTIVE:   DIAGNOSTIC FINDINGS:    PATIENT SURVEYS :  FOTO   66.1   COGNITION: Overall cognitive status: Within functional limits for tasks assessed      POSTURE:   UPPER EXTREMITY ROM:   Active AROM Right eval Left eval R 07/11/22  Shoulder flexion 120 155 140  Shoulder extension     Shoulder abduction 90  130  Shoulder adduction     Shoulder internal rotation 25    Shoulder external rotation 45    Elbow flexion     Elbow extension     Wrist flexion     Wrist extension     Wrist ulnar deviation     Wrist radial deviation     Wrist pronation     Wrist supination     (Blank rows = not tested)  UPPER EXTREMITY MMT:  MMT Right eval Left eval  Shoulder flexion 3+   Shoulder extension    Shoulder abduction 3-   Shoulder adduction    Shoulder internal rotation 4   Shoulder external rotation 4   Middle trapezius    Lower trapezius    Elbow flexion    Elbow extension    Wrist flexion    Wrist extension    Wrist ulnar deviation    Wrist radial deviation    Wrist pronation    Wrist supination    Grip strength (lbs)    (Blank rows = not tested)  SHOULDER SPECIAL TESTS:  JOINT MOBILITY TESTING:  Hypomobile R GHJ, significant limitation for IR,  mod for ER and abd, mild for flexion   PALPATION:     TODAY'S TREATMENT:                                                                                                                                          DATE:   07/13/22: Therapeutic Exercise: Aerobic:   UBE 4 min bwd/fwd Supine:  AAROM/ER at 90 x 15;  Seated:  Standing:  Rows GTB x 25;  Shoulder ER and IR  2 x 10 GTB: AROM/scaption x 10,  abd x 10;  Stretches:  IR behind back with stick x 3 min;  ER/chest stretch in doorway 30 sec x 3;  self posterior shoulder stretch- standing x 2 min;  Neuromuscular Re-education: Manual Therapy: PROM R shoulder all motions, Inf and post mobs; LAD;  Contract relax for IR;    07/11/22: Therapeutic Exercise: Aerobic:  Supine:  Shoulder flexion AAROM/Cane x 15, 2.5lb ;  Seated: pulley/flexion and abduction x 3 min;  Standing:  Circles at wall for scap stabs 2 x 20   Rows GTB x 25;  Shoulder ER and IR  2 x 10 GTB:  Stretches:  IR behind back with stick x 3 min; ER/chest stretch in doorway 30 sec  x 3; self posterior shoulder stretch- supine x 2 min;  Neuromuscular Re-education: Manual Therapy: PROM R shoulder all motions, Inf and post mobs; LAD;     07/07/22: Therapeutic Exercise: Aerobic: Supine:  Shoulder flexion AAROM/Cane x 15;  Seated: pulley/flexion and abduction x 3 min;  S/L : ER 2 lb x 15;  Standing:  Wall slides x 15 on R;   Rows GTB x 25;  Shoulder ER 2 x 10 RTB:  Stretches:  IR behind back with stick x 3 min;  Neuromuscular Re-education: Manual Therapy: PROM R shoulder all motions, Inf and post mobs; LAD;  Mulligan mobs for IR in standing with strap x 2 min; contract relax for IR;    07/05/22: Therapeutic Exercise: Aerobic: Supine:  Shoulder flexion AAROM/Cane x 15; ER butterfly x 15; IR/ER cane AAROM x 20;  Seated: pulley/flexion x 3 min;  Standing:  Wall slides x 15 on R;  Rows GTB x 25;   Stretches:  IR behind back with stick x 3 min; Flexion stretch at rail x 2 min;  Neuromuscular Re-education: Manual Therapy: PROM R shoulder all motions, Inf and post mobs; LAD;    PATIENT EDUCATION: Education details: Reviewed and updated  HEP Person educated: Patient Education method: Explanation, Demonstration, Tactile cues, Verbal cues, and Handouts Education comprehension: verbalized understanding, returned demonstration, verbal cues required, tactile cues required, and needs further education  HOME EXERCISE PROGRAM: Access Code: [email or web address]   ASSESSMENT:  CLINICAL IMPRESSION: 07/13/2022 Pt with improving AROM , still with painful arc, at aobut 2/10 pain. He has stiffness at end range for flex, but mostly with IR/ER, with pain at end range. Plan to continue focus of improving pain free ROM.   Eval: Patient presents with primary complaint of increased pain in R shoulder. He has increased joint stiffness, and lack of full PROM and AROM. He has decreased strength and inability for full functional use of shoulder for lifting, reaching, ADLS and IADLs. Pt to benefit from skilled PT to improve deficits and pain.  OBJECTIVE IMPAIRMENTS: Abnormal gait, decreased activity tolerance, decreased mobility, decreased ROM, decreased strength, hypomobility, increased muscle spasms, impaired UE functional use, and pain.   ACTIVITY LIMITATIONS: carrying, lifting, dressing, reach over head, hygiene/grooming, and locomotion level  PARTICIPATION LIMITATIONS: meal prep, cleaning, laundry, driving, community activity, occupation, and yard work  PERSONAL FACTORS: Time since onset of injury/illness/exacerbation are also affecting patient's functional outcome.   REHAB POTENTIAL: Good  CLINICAL DECISION MAKING: Stable/uncomplicated  EVALUATION COMPLEXITY: Low  GOALS: Goals reviewed with patient? Yes   SHORT TERM GOALS: Target date: 07/13/2022   Pt to be independent with initial HEP  Goal status: INITIAL  2.  Pt to demo improved PROM for flexion and IR,  by at least 10 deg,  Goal status: INITIAL   LONG TERM GOALS: Target date: 08/24/2022   Pt to be independent with final HEP  Goal status: INITIAL  2.  Pt to demo improved AROM to  be WNL to improve ability for ADLs.  Goal status: INITIAL  3.  Pt to demo improved strength of R shoulder to at least 4/5 to improve ability for reach, lift, and IADLs.   Goal status: INITIAL   PLAN: PT FREQUENCY: 1-2x/week  PT DURATION: 8 weeks  PLANNED INTERVENTIONS: Therapeutic exercises, Therapeutic activity, Neuromuscular re-education, Balance training, Gait training, Patient/Family education, Self Care, Joint mobilization, Joint manipulation, Stair training, Aquatic Therapy, Dry Needling, Electrical stimulation, Spinal manipulation, Spinal mobilization, Cryotherapy, Moist heat, Taping, Traction, Ultrasound, Ionotophoresis '4mg'$ /ml Dexamethasone, and  Manual therapy  PLAN FOR NEXT SESSION:    Lyndee Hensen, PT, DPT 9:05 AM  07/13/22

## 2022-07-18 ENCOUNTER — Encounter: Payer: Self-pay | Admitting: Physical Therapy

## 2022-07-18 ENCOUNTER — Ambulatory Visit: Payer: 59 | Admitting: Physical Therapy

## 2022-07-18 DIAGNOSIS — M25511 Pain in right shoulder: Secondary | ICD-10-CM | POA: Diagnosis not present

## 2022-07-18 DIAGNOSIS — G8929 Other chronic pain: Secondary | ICD-10-CM

## 2022-07-18 NOTE — Therapy (Signed)
OUTPATIENT PHYSICAL THERAPY UPPER EXTREMITY TREATMENT   Patient Name: Casey Zavala MRN: [id number] DOB:Nov 15, 1970, 52 y.o., male Today's Date: 07/18/2022  END OF SESSION:  PT End of Session - 07/18/22 0757     Visit Number 6    Number of Visits 16    Date for PT Re-Evaluation 08/24/22    Authorization Type [employer]    PT Start Time 0800    PT Stop Time 0843    PT Time Calculation (min) 43 min    Activity Tolerance Patient tolerated treatment well    Behavior During Therapy WFL for tasks assessed/performed                Past Medical History:  Diagnosis Date   Allergy    Atrial fibrillation ([place])    Basal cell carcinoma of skin    Hyperlipidemia    no meds   Left groin hernia    actually improved with exercise   Past Surgical History:  Procedure Laterality Date   ATRIAL FIBRILLATION ABLATION N/A 10/15/2021   Procedure: ATRIAL FIBRILLATION ABLATION;  Surgeon: Constance Haw, MD;  Location: [facility];  Service: Cardiovascular;  Laterality: N/A;   BASAL CELL CARCINOMA EXCISION     WISDOM TOOTH EXTRACTION     Patient Active Problem List   Diagnosis Date Noted   Paroxysmal atrial fibrillation ([place]) 03/12/2021   History of skin cancer 10/31/2017   Sinus node dysfunction ([place]) 09/06/2014   Hyperlipidemia 09/06/2014   Left groin hernia 01/17/2014    PCP: Garret Reddish  REFERRING PROVIDER: Lynne Leader  REFERRING DIAG: Lynne Leader  THERAPY DIAG:  Chronic right shoulder pain  Rationale for Evaluation and Treatment: Rehabilitation  ONSET DATE:   SUBJECTIVE:                                                                                                                                                                                      SUBJECTIVE STATEMENT: 07/18/2022: States he feels like he is doing quite a bit better. Much less pain, and has been using his arm more for reaching, lifting. Feels like he is making progress.   Eval: Pt with  onset of R shoulder pain about 4 months ago. States pain in arm, when Lifting out to the side and across body. He is R handed. He sits for work, retirement community, Press photographer. Computer work. States No pain at work.  Would like to start walking more, does not like to exercise.   PERTINENT HISTORY:  A fib   PAIN:  Are you having pain? Yes: NPRS scale: 6-7 /10 Pain location: R shoulder/arm Pain description: Sore, intermittent  Aggravating factors:  lifting out to sid,across body  Relieving factors: rest  PRECAUTIONS: None  WEIGHT BEARING RESTRICTIONS: No  FALLS:  Has patient fallen in last 6 months? No  PLOF: Independent  PATIENT GOALS: decreased pain, increased movement    OBJECTIVE:   DIAGNOSTIC FINDINGS:    PATIENT SURVEYS :  FOTO   66.1   COGNITION: Overall cognitive status: Within functional limits for tasks assessed      POSTURE:   UPPER EXTREMITY ROM:  07/18/22:  PROM:  flex:    abd;         Active AROM Right eval Left eval R 07/11/22  Shoulder flexion 120 155 140  Shoulder extension     Shoulder abduction 90  130  Shoulder adduction     Shoulder internal rotation 25    Shoulder external rotation 45    Elbow flexion     Elbow extension     Wrist flexion     Wrist extension     Wrist ulnar deviation     Wrist radial deviation     Wrist pronation     Wrist supination     (Blank rows = not tested)  UPPER EXTREMITY MMT:  MMT Right eval Left eval R 07/18/22  Shoulder flexion 3+  4  Shoulder extension     Shoulder abduction 3-  4  Shoulder adduction     Shoulder internal rotation 4  4  Shoulder external rotation 4  4  Middle trapezius     Lower trapezius     Elbow flexion     Elbow extension     Wrist flexion     Wrist extension     Wrist ulnar deviation     Wrist radial deviation     Wrist pronation     Wrist supination     Grip strength (lbs)     (Blank rows = not tested)  SHOULDER SPECIAL TESTS:  JOINT MOBILITY TESTING:   Hypomobile R GHJ, significant limitation for IR,  mod for ER and abd, mild for flexion   PALPATION:     TODAY'S TREATMENT:                                                                                                                                         DATE:   07/18/22: Therapeutic Exercise: Aerobic:   UBE 4 min bwd/fwd Supine:   Seated:  Standing:  Rows GTB x 25;  Shoulder ER and IR  2 x 10 GTB: AROM/scaption x 10,  abd x 10;  Stretches:  IR behind back with stick x 3 min;  ER/chest stretch in doorway 30 sec x 3;  self posterior shoulder stretch- standing x 2 min;  Neuromuscular Re-education: Manual Therapy: PROM R shoulder all motions- focus on ER at 45 and 90 deg;  Inf and post mobs; LAD;  Contract relax for IR;    07/13/22: Therapeutic  Exercise: Aerobic:   UBE 4 min bwd/fwd Supine:  AAROM/ER at 90 x 15;  Seated:  Standing:  Rows GTB x 25;  Shoulder ER and IR  2 x 10 GTB: AROM/scaption x 10,  abd x 10;  Stretches:  IR behind back with stick x 3 min;  ER/chest stretch in doorway 30 sec x 3;  self posterior shoulder stretch- standing x 2 min;  Neuromuscular Re-education: Manual Therapy: PROM R shoulder all motions, Inf and post mobs; LAD;  Contract relax for IR;    07/11/22: Therapeutic Exercise: Aerobic:  Supine:  Shoulder flexion AAROM/Cane x 15, 2.5lb ;  Seated: pulley/flexion and abduction x 3 min;  Standing:  Circles at wall for scap stabs 2 x 20   Rows GTB x 25;  Shoulder ER and IR  2 x 10 GTB:  Stretches:  IR behind back with stick x 3 min; ER/chest stretch in doorway 30 sec x 3; self posterior shoulder stretch- supine x 2 min;  Neuromuscular Re-education: Manual Therapy: PROM R shoulder all motions, Inf and post mobs; LAD;     07/07/22: Therapeutic Exercise: Aerobic: Supine:  Shoulder flexion AAROM/Cane x 15;  Seated: pulley/flexion and abduction x 3 min;  S/L : ER 2 lb x 15;  Standing:  Wall slides x 15 on R;   Rows GTB x 25;  Shoulder ER 2 x 10 RTB:   Stretches:  IR behind back with stick x 3 min;  Neuromuscular Re-education: Manual Therapy: PROM R shoulder all motions, Inf and post mobs; LAD;  [place] mobs for IR in standing with strap x 2 min; contract relax for IR;    PATIENT EDUCATION: Education details: Reviewed and updated HEP Person educated: Patient Education method: Consulting civil engineer, Demonstration, Tactile cues, Verbal cues, and Handouts Education comprehension: verbalized understanding, returned demonstration, verbal cues required, tactile cues required, and needs further education  HOME EXERCISE PROGRAM: Access Code: [email or web address]   ASSESSMENT:  CLINICAL IMPRESSION: 07/18/2022 Pt with improving AROM , near normal flex and abd today, without pain, which is a great improvement. He still has stiffness, limited ROM, and pain at end range for IR/ER, and will benefit from continued focus on this.   Eval: Patient presents with primary complaint of increased pain in R shoulder. He has increased joint stiffness, and lack of full PROM and AROM. He has decreased strength and inability for full functional use of shoulder for lifting, reaching, ADLS and IADLs. Pt to benefit from skilled PT to improve deficits and pain.  OBJECTIVE IMPAIRMENTS: Abnormal gait, decreased activity tolerance, decreased mobility, decreased ROM, decreased strength, hypomobility, increased muscle spasms, impaired UE functional use, and pain.   ACTIVITY LIMITATIONS: carrying, lifting, dressing, reach over head, hygiene/grooming, and locomotion level  PARTICIPATION LIMITATIONS: meal prep, cleaning, laundry, driving, community activity, occupation, and yard work  PERSONAL FACTORS: Time since onset of injury/illness/exacerbation are also affecting patient's functional outcome.   REHAB POTENTIAL: Good  CLINICAL DECISION MAKING: Stable/uncomplicated  EVALUATION COMPLEXITY: Low  GOALS: Goals reviewed with patient? Yes   SHORT TERM GOALS: Target date:  07/13/2022   Pt to be independent with initial HEP  Goal status: INITIAL  2.  Pt to demo improved PROM for flexion and IR,  by at least 10 deg,  Goal status: INITIAL   LONG TERM GOALS: Target date: 08/24/2022   Pt to be independent with final HEP  Goal status: INITIAL  2.  Pt to demo improved AROM to be WNL to improve ability for ADLs.  Goal status:  INITIAL  3.  Pt to demo improved strength of R shoulder to at least 4/5 to improve ability for reach, lift, and IADLs.   Goal status: INITIAL   PLAN: PT FREQUENCY: 1-2x/week  PT DURATION: 8 weeks  PLANNED INTERVENTIONS: Therapeutic exercises, Therapeutic activity, Neuromuscular re-education, Balance training, Gait training, Patient/Family education, Self Care, Joint mobilization, Joint manipulation, Stair training, Aquatic Therapy, Dry Needling, Electrical stimulation, Spinal manipulation, Spinal mobilization, Cryotherapy, Moist heat, Taping, Traction, Ultrasound, Ionotophoresis '4mg'$ /ml Dexamethasone, and Manual therapy  PLAN FOR NEXT SESSION:    Lyndee Hensen, PT, DPT 11:24 AM  07/18/22

## 2022-07-20 ENCOUNTER — Encounter: Payer: Self-pay | Admitting: Physical Therapy

## 2022-07-20 ENCOUNTER — Ambulatory Visit: Payer: 59 | Admitting: Physical Therapy

## 2022-07-20 DIAGNOSIS — M25511 Pain in right shoulder: Secondary | ICD-10-CM

## 2022-07-20 DIAGNOSIS — G8929 Other chronic pain: Secondary | ICD-10-CM

## 2022-07-20 NOTE — Therapy (Signed)
OUTPATIENT PHYSICAL THERAPY UPPER EXTREMITY TREATMENT   Patient Name: Casey Zavala MRN: [id number] DOB:12/08/1970, 52 y.o., male Today's Date: 07/20/2022  END OF SESSION:  PT End of Session - 07/20/22 0909     Visit Number 7    Number of Visits 16    Date for PT Re-Evaluation 08/24/22    Authorization Type [employer]    PT Start Time 0807    PT Stop Time 0851    PT Time Calculation (min) 44 min    Activity Tolerance Patient tolerated treatment well    Behavior During Therapy [facility] for tasks assessed/performed                 Past Medical History:  Diagnosis Date   Allergy    Atrial fibrillation ([place])    Basal cell carcinoma of skin    Hyperlipidemia    no meds   Left groin hernia    actually improved with exercise   Past Surgical History:  Procedure Laterality Date   ATRIAL FIBRILLATION ABLATION N/A 10/15/2021   Procedure: ATRIAL FIBRILLATION ABLATION;  Surgeon: Constance Haw, MD;  Location: [facility];  Service: Cardiovascular;  Laterality: N/A;   BASAL CELL CARCINOMA EXCISION     WISDOM TOOTH EXTRACTION     Patient Active Problem List   Diagnosis Date Noted   Paroxysmal atrial fibrillation ([place]) 03/12/2021   History of skin cancer 10/31/2017   Sinus node dysfunction ([place]) 09/06/2014   Hyperlipidemia 09/06/2014   Left groin hernia 01/17/2014    PCP: Garret Reddish  REFERRING PROVIDER: Lynne Leader  REFERRING DIAG: Lynne Leader  THERAPY DIAG:  Chronic right shoulder pain  Rationale for Evaluation and Treatment: Rehabilitation  ONSET DATE:   SUBJECTIVE:                                                                                                                                                                                      SUBJECTIVE STATEMENT: 07/20/2022: Pt reporting improvements.   Eval: Pt with onset of R shoulder pain about 4 months ago. States pain in arm, when Lifting out to the side and across body. He is R handed. He  sits for work, retirement community, Press photographer. Computer work. States No pain at work.  Would like to start walking more, does not like to exercise.   PERTINENT HISTORY:  A fib   PAIN:  Are you having pain? Yes: NPRS scale: 2-3 /10 Pain location: R shoulder/arm Pain description: Sore, intermittent  Aggravating factors: lifting out to side  Relieving factors: rest  PRECAUTIONS: None  WEIGHT BEARING RESTRICTIONS: No  FALLS:  Has patient fallen in last 6 months?  No  PLOF: Independent  PATIENT GOALS: decreased pain, increased movement    OBJECTIVE:   DIAGNOSTIC FINDINGS:    PATIENT SURVEYS :  FOTO   66.1   COGNITION: Overall cognitive status: Within functional limits for tasks assessed      POSTURE:   UPPER EXTREMITY ROM:  07/18/22:  PROM:  flex:    abd;         Active AROM Right eval Left eval R 07/11/22  Shoulder flexion 120 155 140  Shoulder extension     Shoulder abduction 90  130  Shoulder adduction     Shoulder internal rotation 25    Shoulder external rotation 45    Elbow flexion     Elbow extension     Wrist flexion     Wrist extension     Wrist ulnar deviation     Wrist radial deviation     Wrist pronation     Wrist supination     (Blank rows = not tested)  UPPER EXTREMITY MMT:  MMT Right eval Left eval R 07/18/22  Shoulder flexion 3+  4  Shoulder extension     Shoulder abduction 3-  4  Shoulder adduction     Shoulder internal rotation 4  4  Shoulder external rotation 4  4  Middle trapezius     Lower trapezius     Elbow flexion     Elbow extension     Wrist flexion     Wrist extension     Wrist ulnar deviation     Wrist radial deviation     Wrist pronation     Wrist supination     Grip strength (lbs)     (Blank rows = not tested)  SHOULDER SPECIAL TESTS:  JOINT MOBILITY TESTING:  Hypomobile R GHJ, significant limitation for IR,  mod for ER and abd, mild for flexion   PALPATION:     TODAY'S TREATMENT:                                                                                                                                          DATE:   07/20/22: Therapeutic Exercise: Aerobic:   UBE 4 min bwd/fwd Supine:   Seated:  Standing:  Rows GTB x 25;  Shoulder ER and IR  2 x 10 GTB: AROM/scaption 2lb x 10,  abd x 10;  Stretches:  IR behind back with stick x 3 min;   self IR stretch-sleeper x 3 min in S/L Neuromuscular Re-education: Manual Therapy: PROM R shoulder all motions- focus on ER at 45 and 90 deg;  Inf and post mobs; LAD;  Contract relax for IR;  [place] mob with strap for IR (standing)   07/18/22: Therapeutic Exercise: Aerobic:   UBE 4 min bwd/fwd Supine:   Seated:  Standing:  Rows GTB x 25;  Shoulder ER and IR  2 x 10  GTB: AROM/scaption x 10,  abd x 10;  Stretches:  IR behind back with stick x 3 min;  ER/chest stretch in doorway 30 sec x 3;  self posterior shoulder stretch- standing x 2 min;  Neuromuscular Re-education: Manual Therapy: PROM R shoulder all motions- focus on ER at 45 and 90 deg;  Inf and post mobs; LAD;  Contract relax for IR;    07/13/22: Therapeutic Exercise: Aerobic:   UBE 4 min bwd/fwd Supine:  AAROM/ER at 90 x 15;  Seated:  Standing:  Rows GTB x 25;  Shoulder ER and IR  2 x 10 GTB: AROM/scaption x 10,  abd x 10;  Stretches:  IR behind back with stick x 3 min;  ER/chest stretch in doorway 30 sec x 3;  self posterior shoulder stretch- standing x 2 min;  Neuromuscular Re-education: Manual Therapy: PROM R shoulder all motions, Inf and post mobs; LAD;  Contract relax for IR;      PATIENT EDUCATION: Education details: Reviewed and updated HEP Person educated: Patient Education method: Explanation, Demonstration, Tactile cues, Verbal cues, and Handouts Education comprehension: verbalized understanding, returned demonstration, verbal cues required, tactile cues required, and needs further education  HOME EXERCISE PROGRAM: Access Code:  [email or web address]   ASSESSMENT:  CLINICAL IMPRESSION: 07/20/2022 Pt making good progress. Improved horiz add with improved ROM and less pain today. Still sore and limited with IR and ER. Will continue to focus on this for restoring full, functional ROM.    Eval: Patient presents with primary complaint of increased pain in R shoulder. He has increased joint stiffness, and lack of full PROM and AROM. He has decreased strength and inability for full functional use of shoulder for lifting, reaching, ADLS and IADLs. Pt to benefit from skilled PT to improve deficits and pain.  OBJECTIVE IMPAIRMENTS: Abnormal gait, decreased activity tolerance, decreased mobility, decreased ROM, decreased strength, hypomobility, increased muscle spasms, impaired UE functional use, and pain.   ACTIVITY LIMITATIONS: carrying, lifting, dressing, reach over head, hygiene/grooming, and locomotion level  PARTICIPATION LIMITATIONS: meal prep, cleaning, laundry, driving, community activity, occupation, and yard work  PERSONAL FACTORS: Time since onset of injury/illness/exacerbation are also affecting patient's functional outcome.   REHAB POTENTIAL: Good  CLINICAL DECISION MAKING: Stable/uncomplicated  EVALUATION COMPLEXITY: Low  GOALS: Goals reviewed with patient? Yes   SHORT TERM GOALS: Target date: 07/13/2022   Pt to be independent with initial HEP  Goal status: INITIAL  2.  Pt to demo improved PROM for flexion and IR,  by at least 10 deg,  Goal status: INITIAL   LONG TERM GOALS: Target date: 08/24/2022   Pt to be independent with final HEP  Goal status: INITIAL  2.  Pt to demo improved AROM to be WNL to improve ability for ADLs.  Goal status: INITIAL  3.  Pt to demo improved strength of R shoulder to at least 4/5 to improve ability for reach, lift, and IADLs.   Goal status: INITIAL   PLAN: PT FREQUENCY: 1-2x/week  PT DURATION: 8 weeks  PLANNED INTERVENTIONS: Therapeutic exercises, Therapeutic  activity, Neuromuscular re-education, Balance training, Gait training, Patient/Family education, Self Care, Joint mobilization, Joint manipulation, Stair training, Aquatic Therapy, Dry Needling, Electrical stimulation, Spinal manipulation, Spinal mobilization, Cryotherapy, Moist heat, Taping, Traction, Ultrasound, Ionotophoresis '4mg'$ /ml Dexamethasone, and Manual therapy  PLAN FOR NEXT SESSION:    Lyndee Hensen, PT, DPT 9:10 AM  07/20/22

## 2022-07-21 ENCOUNTER — Encounter (HOSPITAL_COMMUNITY): Payer: Self-pay | Admitting: Physician Assistant

## 2022-07-21 ENCOUNTER — Ambulatory Visit (HOSPITAL_COMMUNITY)
Admission: RE | Admit: 2022-07-21 | Discharge: 2022-07-21 | Disposition: A | Discharge status: Home / Self Care | Payer: 59 | Source: Ambulatory Visit | Attending: Physician Assistant | Admitting: Physician Assistant

## 2022-07-21 VITALS — BP 108/88 | HR 104 | Ht 72.0 in | Wt 188.2 lb

## 2022-07-21 DIAGNOSIS — Z79899 Other long term (current) drug therapy: Secondary | ICD-10-CM | POA: Diagnosis not present

## 2022-07-21 DIAGNOSIS — Z8249 Family history of ischemic heart disease and other diseases of the circulatory system: Secondary | ICD-10-CM | POA: Insufficient documentation

## 2022-07-21 DIAGNOSIS — I4719 Other supraventricular tachycardia: Secondary | ICD-10-CM | POA: Diagnosis not present

## 2022-07-21 DIAGNOSIS — I480 Paroxysmal atrial fibrillation: Secondary | ICD-10-CM | POA: Diagnosis not present

## 2022-07-21 NOTE — Progress Notes (Signed)
Primary Care Physician: Marin Olp, MD Primary Cardiologist: Dr Debara Pickett Primary Electrophysiologist: Dr Curt Bears  Referring Physician: Dr Vicente Males is a 52 y.o. male with a history of atrial fibrillation, atrial tachycardia who presents for follow up in the [facility].  The patient was initially diagnosed with atrial fibrillation 04/2020 on his smart watch. Patient has a CHADS2VASC score of 0. He has noticed more frequent irregular rhythm and heart racing despite increasing metoprolol, 5-6 episodes. His longest episode lasted 8 hours. When in afib he "feels off" and does have palpitations. See strips in phone note 03/05/21.   Patient is s/p afib ablation with Dr Curt Bears on 10/15/21. Metoprolol and Xarelto were discontinued on 01/17/22. He noted increased heart rates with is ectopic atrial rhythm and was started on carvedilol 04/2022. Patient reports today that he has done very will with no symptoms of afib. His heart rates generally stay 90s to low 100s.   Today, he denies symptoms of palpitations, chest pain, shortness of breath, orthopnea, PND, lower extremity edema, dizziness, presyncope, syncope, snoring, daytime somnolence, bleeding, or neurologic sequela. The patient is tolerating medications without difficulties and is otherwise without complaint today.    Atrial Fibrillation Risk Factors:  he does not have symptoms or diagnosis of sleep apnea. he does not have a history of rheumatic fever. he does have a history of alcohol use. The patient does have a history of early familial atrial fibrillation or other arrhythmias. Father has afib.  he has a BMI of Body mass index is 25.52 kg/m.Marland Kitchen Filed Weights   07/21/22 0825  Weight: 85.4 kg    Family History  Problem Relation Age of Onset   Hypothyroidism Mother        also dementia   Diabetes Mother    Stroke Father    Depression Sister    Diabetes Brother    Schizophrenia Brother     Kidney disease Brother    Diabetes Maternal Grandmother    Dementia Maternal Grandfather    Heart attack Paternal Grandmother    Lung cancer Paternal Grandfather    Colon cancer Neg Hx    Colon polyps Neg Hx    Esophageal cancer Neg Hx    Rectal cancer Neg Hx    Stomach cancer Neg Hx      Atrial Fibrillation Management history:  Previous antiarrhythmic drugs: none Previous cardioversions: none Previous ablations: 10/15/21 CHADS2VASC score: 0 Anticoagulation history: Xarelto    Past Medical History:  Diagnosis Date   Allergy    Atrial fibrillation ([place])    Basal cell carcinoma of skin    Hyperlipidemia    no meds   Left groin hernia    actually improved with exercise   Past Surgical History:  Procedure Laterality Date   ATRIAL FIBRILLATION ABLATION N/A 10/15/2021   Procedure: ATRIAL FIBRILLATION ABLATION;  Surgeon: Constance Haw, MD;  Location: [facility];  Service: Cardiovascular;  Laterality: N/A;   BASAL CELL CARCINOMA EXCISION     WISDOM TOOTH EXTRACTION      Current Outpatient Medications  Medication Sig Dispense Refill   busPIRone (BUSPAR) 5 MG tablet Take 1 tablet (5 mg total) by mouth 2 (two) times daily as needed (anxiety with travel). 10 tablet 0   carvedilol (COREG) 6.25 MG tablet Take 1 tablet (6.25 mg total) by mouth 2 (two) times daily. 180 tablet 3   psyllium (METAMUCIL) 58.6 % powder Take 1 packet by mouth at  bedtime.     Red Yeast Rice Extract (RED YEAST RICE PO) Take 2 capsules by mouth 2 (two) times daily.     sildenafil (VIAGRA) 100 MG tablet Take 1 tablet (100 mg total) by mouth daily as needed for erectile dysfunction. 10 tablet 5   No current facility-administered medications for this encounter.    Allergies  Allergen Reactions   Eliquis [Apixaban] Rash    Social History   Socioeconomic History   Marital status: Married    Spouse name: Not on file   Number of children: 1   Years of education: college   Highest education  level: Not on file  Occupational History   Occupation: Development worker, international aid    Employer: [employer]  Tobacco Use   Smoking status: Never   Smokeless tobacco: Never   Tobacco comments:    Never smoke 11/09/21  Substance and Sexual Activity   Alcohol use: Yes    Alcohol/week: 2.0 standard drinks of alcohol    Types: 1 Glasses of wine, 1 Cans of beer per week    Comment: 1-2 beers every other night 11/09/21   Drug use: No   Sexual activity: Not on file  Other Topics Concern   Not on file  Social History Narrative   Married with 67 year old daughter in 2019      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 Hoping to retire around 46   Quaker background      Hobbies: exercising   Social Determinants of Radio broadcast assistant Strain: Not on file  Food Insecurity: Not on file  Transportation Needs: Not on file  Physical Activity: Not on file  Stress: Not on file  Social Connections: Not on file  Intimate Partner Violence: Not on file     ROS- All systems are reviewed and negative except as per the HPI above.  Physical Exam: Vitals:   07/21/22 0825  BP: 108/88  Pulse: (!) 104  Weight: 85.4 kg  Height: 6' (1.829 m)    GEN- The patient is a well appearing male, alert and oriented x 3 today.   HEENT-head normocephalic, atraumatic, sclera clear, conjunctiva pink, hearing intact, trachea midline. Lungs- Clear to ausculation bilaterally, normal work of breathing Heart- Regular rate and rhythm, no murmurs, rubs or gallops  GI- soft, NT, ND, + BS Extremities- no clubbing, cyanosis, or edema MS- no significant deformity or atrophy Skin- no rash or lesion Psych- euthymic mood, full affect Neuro- strength and sensation are intact   Wt Readings from Last 3 Encounters:  07/21/22 85.4 kg  06/28/22 86.2 kg  06/10/22 84.1 kg    EKG today demonstrates  Ectopic atrial tachycardia Vent. rate 104 BPM PR interval  192 ms QRS duration 82 ms QT/QTcB 322/423 ms  Echo 04/13/21 demonstrated   1. Left ventricular ejection fraction, by estimation, is 45 to 50%. The  left ventricle has mildly decreased function. The left ventricle has no  regional wall motion abnormalities. Left ventricular diastolic parameters are indeterminate.   2. Right ventricular systolic function is normal. The right ventricular  size is normal. Tricuspid regurgitation signal is inadequate for assessing PA pressure.   3. The mitral valve is normal in structure. No evidence of mitral valve  regurgitation. No evidence of mitral stenosis.   4. The aortic valve has an indeterminant number of cusps. Aortic valve regurgitation is not visualized. No aortic stenosis is present.   5.  There is borderline dilatation of the ascending aorta, measuring 37  mm.   6. The inferior vena cava is normal in size with greater than 50%  respiratory variability, suggesting right atrial pressure of 3 mmHg.    Epic records are reviewed at length today  CHA2DS2-VASc Score = 0  The patient's score is based upon: CHF History: 0 HTN History: 0 Diabetes History: 0 Stroke History: 0 Vascular Disease History: 0 Age Score: 0 Gender Score: 0        ASSESSMENT AND PLAN: 1. Paroxysmal Atrial Fibrillation/atrial tach The patient's CHA2DS2-VASc score is 0, indicating a 0.2% annual risk of stroke.   Patient is s/p afib ablation 10/15/21 Continue carvedilol 6.25 mg BID Off anticoagulation with low CV score. Smart watch for home monitoring.  Patient would like to discuss options for treating his atrial tachycardia, if anything needs to be done.    Follow up with Dr Curt Bears in 3 months. AF clinic as needed.    [facility] [address] [place], [place] [address] [phone] 07/21/2022 8:43 AM

## 2022-07-25 ENCOUNTER — Encounter: Payer: 59 | Admitting: Physical Therapy

## 2022-07-27 ENCOUNTER — Encounter: Payer: Self-pay | Admitting: Physical Therapy

## 2022-07-27 ENCOUNTER — Ambulatory Visit: Payer: 59 | Admitting: Physical Therapy

## 2022-07-27 DIAGNOSIS — G8929 Other chronic pain: Secondary | ICD-10-CM

## 2022-07-27 DIAGNOSIS — M25511 Pain in right shoulder: Secondary | ICD-10-CM

## 2022-07-27 NOTE — Therapy (Signed)
OUTPATIENT PHYSICAL THERAPY UPPER EXTREMITY TREATMENT   Patient Name: Casey Zavala MRN: [id number] DOB:[birth year], 52 y.o., male Today's Date: 07/27/2022  END OF SESSION:  PT End of Session - 07/27/22 0757     Visit Number 8    Number of Visits 16    Date for PT Re-Evaluation 08/24/22    Authorization Type [employer]    PT Start Time 0800    PT Stop Time 0846    PT Time Calculation (min) 46 min    Activity Tolerance Patient tolerated treatment well    Behavior During Therapy WFL for tasks assessed/performed                 Past Medical History:  Diagnosis Date   Allergy    Atrial fibrillation ([place])    Basal cell carcinoma of skin    Hyperlipidemia    no meds   Left groin hernia    actually improved with exercise   Past Surgical History:  Procedure Laterality Date   ATRIAL FIBRILLATION ABLATION N/A 10/15/2021   Procedure: ATRIAL FIBRILLATION ABLATION;  Surgeon: Constance Haw, MD;  Location: [facility];  Service: Cardiovascular;  Laterality: N/A;   BASAL CELL CARCINOMA EXCISION     WISDOM TOOTH EXTRACTION     Patient Active Problem List   Diagnosis Date Noted   Paroxysmal atrial fibrillation ([place]) 03/12/2021   History of skin cancer 10/31/2017   Sinus node dysfunction ([place]) 09/06/2014   Hyperlipidemia 09/06/2014   Left groin hernia 01/17/2014    PCP: Garret Reddish  REFERRING PROVIDER: Lynne Leader  REFERRING DIAG: Lynne Leader  THERAPY DIAG:  Chronic right shoulder pain  Rationale for Evaluation and Treatment: Rehabilitation  ONSET DATE:   SUBJECTIVE:                                                                                                                                                                                      SUBJECTIVE STATEMENT: 07/27/2022: Pt states shoulder improving. Still limited with IR behind back.   Eval: Pt with onset of R shoulder pain about 4 months ago. States pain in arm, when Lifting out to the side and  across body. He is R handed. He sits for work, retirement community, Press photographer. Computer work. States No pain at work.  Would like to start walking more, does not like to exercise.   PERTINENT HISTORY:  A fib   PAIN:  Are you having pain? Yes: NPRS scale: 2-3 /10 Pain location: R shoulder/arm Pain description: Sore, intermittent  Aggravating factors: lifting out to side  Relieving factors: rest  PRECAUTIONS: None  WEIGHT BEARING RESTRICTIONS: No  FALLS:  Has patient fallen in last 6 months? No  PLOF: Independent  PATIENT GOALS: decreased pain, increased movement    OBJECTIVE:   DIAGNOSTIC FINDINGS:    PATIENT SURVEYS :  FOTO   66.1   COGNITION: Overall cognitive status: Within functional limits for tasks assessed      POSTURE:   UPPER EXTREMITY ROM:  07/18/22:  PROM:  flex:    abd;         Active AROM Right eval Left eval R 07/11/22  Shoulder flexion 120 155 140  Shoulder extension     Shoulder abduction 90  130  Shoulder adduction     Shoulder internal rotation 25    Shoulder external rotation 45    Elbow flexion     Elbow extension     Wrist flexion     Wrist extension     Wrist ulnar deviation     Wrist radial deviation     Wrist pronation     Wrist supination     (Blank rows = not tested)  UPPER EXTREMITY MMT:  MMT Right eval Left eval R 07/18/22  Shoulder flexion 3+  4  Shoulder extension     Shoulder abduction 3-  4  Shoulder adduction     Shoulder internal rotation 4  4  Shoulder external rotation 4  4  Middle trapezius     Lower trapezius     Elbow flexion     Elbow extension     Wrist flexion     Wrist extension     Wrist ulnar deviation     Wrist radial deviation     Wrist pronation     Wrist supination     Grip strength (lbs)     (Blank rows = not tested)  SHOULDER SPECIAL TESTS:  JOINT MOBILITY TESTING:  Hypomobile R GHJ, significant limitation for IR,  mod for ER and abd, mild for flexion   PALPATION:      TODAY'S TREATMENT:                                                                                                                                         DATE:   07/27/22: Therapeutic Exercise: Aerobic:  UBE 4 min bwd/fwd Supine:   Seated:  Standing:  Rows GTB x 25;  Shoulder ER  2 x 10 GTB:  Stretches:  IR behind back with stick x 3 min;   doorway 15 sec x 3; ER at 90 with stick x 15;  Neuromuscular Re-education: Manual Therapy: PROM R shoulder all motions-  Inf , ant, and post mobs; LAD;  Contract relax for IR;  Mulligan mob with strap for IR (standing)   07/20/22: Therapeutic Exercise: Aerobic:   UBE 4 min bwd/fwd Supine:   Seated:  Standing:  Rows GTB x 25;  Shoulder ER and IR  2 x 10 GTB: AROM/scaption 2lb x 10,  abd x 10;  Stretches:  IR behind back with stick x 3 min;   self IR stretch-sleeper x 3 min in S/L Neuromuscular Re-education: Manual Therapy: PROM R shoulder all motions- focus on ER at 45 and 90 deg;  Inf and post mobs; LAD;  Contract relax for IR;  Marquez mob with strap for IR (standing)   07/18/22: Therapeutic Exercise: Aerobic:   UBE 4 min bwd/fwd Supine:   Seated:  Standing:  Rows GTB x 25;  Shoulder ER and IR  2 x 10 GTB: AROM/scaption x 10,  abd x 10;  Stretches:  IR behind back with stick x 3 min;  ER/chest stretch in doorway 30 sec x 3;  self posterior shoulder stretch- standing x 2 min;  Neuromuscular Re-education: Manual Therapy: PROM R shoulder all motions- focus on ER at 45 and 90 deg;  Inf and post mobs; LAD;  Contract relax for IR;    07/13/22: Therapeutic Exercise: Aerobic:   UBE 4 min bwd/fwd Supine:  AAROM/ER at 90 x 15;  Seated:  Standing:  Rows GTB x 25;  Shoulder ER and IR  2 x 10 GTB: AROM/scaption x 10,  abd x 10;  Stretches:  IR behind back with stick x 3 min;  ER/chest stretch in doorway 30 sec x 3;  self posterior shoulder stretch- standing x 2 min;  Neuromuscular Re-education: Manual Therapy: PROM R shoulder all motions, Inf  and post mobs; LAD;  Contract relax for IR;    PATIENT EDUCATION: Education details: Reviewed and updated HEP Person educated: Patient Education method: Explanation, Demonstration, Tactile cues, Verbal cues, and Handouts Education comprehension: verbalized understanding, returned demonstration, verbal cues required, tactile cues required, and needs further education  HOME EXERCISE PROGRAM: Access Code: [email or web address]   ASSESSMENT:  CLINICAL IMPRESSION: 07/27/2022 Pt with continued stiffness at end range for IR as main limitation. ER doing better, with less pain. Slight limitation for end range flexion. Encouraged frequent ROM throughout day with HEP, for best outcome.    Eval: Patient presents with primary complaint of increased pain in R shoulder. He has increased joint stiffness, and lack of full PROM and AROM. He has decreased strength and inability for full functional use of shoulder for lifting, reaching, ADLS and IADLs. Pt to benefit from skilled PT to improve deficits and pain.  OBJECTIVE IMPAIRMENTS: Abnormal gait, decreased activity tolerance, decreased mobility, decreased ROM, decreased strength, hypomobility, increased muscle spasms, impaired UE functional use, and pain.   ACTIVITY LIMITATIONS: carrying, lifting, dressing, reach over head, hygiene/grooming, and locomotion level  PARTICIPATION LIMITATIONS: meal prep, cleaning, laundry, driving, community activity, occupation, and yard work  PERSONAL FACTORS: Time since onset of injury/illness/exacerbation are also affecting patient's functional outcome.   REHAB POTENTIAL: Good  CLINICAL DECISION MAKING: Stable/uncomplicated  EVALUATION COMPLEXITY: Low  GOALS: Goals reviewed with patient? Yes   SHORT TERM GOALS: Target date: 07/13/2022   Pt to be independent with initial HEP  Goal status: INITIAL  2.  Pt to demo improved PROM for flexion and IR,  by at least 10 deg,  Goal status: INITIAL   LONG TERM GOALS: Target  date: 08/24/2022   Pt to be independent with final HEP  Goal status: INITIAL  2.  Pt to demo improved AROM to be WNL to improve ability for ADLs.  Goal status: INITIAL  3.  Pt to demo improved strength of R shoulder to at least 4/5 to improve ability for reach, lift, and IADLs.   Goal status: INITIAL  PLAN: PT FREQUENCY: 1-2x/week  PT DURATION: 8 weeks  PLANNED INTERVENTIONS: Therapeutic exercises, Therapeutic activity, Neuromuscular re-education, Balance training, Gait training, Patient/Family education, Self Care, Joint mobilization, Joint manipulation, Stair training, Aquatic Therapy, Dry Needling, Electrical stimulation, Spinal manipulation, Spinal mobilization, Cryotherapy, Moist heat, Taping, Traction, Ultrasound, Ionotophoresis '4mg'$ /ml Dexamethasone, and Manual therapy  PLAN FOR NEXT SESSION:    Lyndee Hensen, PT, DPT 10:14 AM  07/27/22

## 2022-08-03 ENCOUNTER — Ambulatory Visit: Payer: 59 | Admitting: Physical Therapy

## 2022-08-03 ENCOUNTER — Encounter: Payer: Self-pay | Admitting: Physical Therapy

## 2022-08-03 DIAGNOSIS — M25511 Pain in right shoulder: Secondary | ICD-10-CM | POA: Diagnosis not present

## 2022-08-03 DIAGNOSIS — G8929 Other chronic pain: Secondary | ICD-10-CM

## 2022-08-03 NOTE — Therapy (Signed)
OUTPATIENT PHYSICAL THERAPY UPPER EXTREMITY TREATMENT   Patient Name: Casey Zavala MRN: [id number] DOB:1971/02/10, 52 y.o., male Today's Date: 08/03/2022  END OF SESSION:  PT End of Session - 08/03/22 0850     Visit Number 9    Number of Visits 16    Date for PT Re-Evaluation 08/24/22    Authorization Type [employer]    PT Start Time [phone]    PT Stop Time 0930    PT Time Calculation (min) 44 min    Activity Tolerance Patient tolerated treatment well    Behavior During Therapy WFL for tasks assessed/performed                 Past Medical History:  Diagnosis Date   Allergy    Atrial fibrillation ([place])    Basal cell carcinoma of skin    Hyperlipidemia    no meds   Left groin hernia    actually improved with exercise   Past Surgical History:  Procedure Laterality Date   ATRIAL FIBRILLATION ABLATION N/A 10/15/2021   Procedure: ATRIAL FIBRILLATION ABLATION;  Surgeon: Constance Haw, MD;  Location: [facility];  Service: Cardiovascular;  Laterality: N/A;   BASAL CELL CARCINOMA EXCISION     WISDOM TOOTH EXTRACTION     Patient Active Problem List   Diagnosis Date Noted   Paroxysmal atrial fibrillation ([place]) 03/12/2021   History of skin cancer 10/31/2017   Sinus node dysfunction ([place]) 09/06/2014   Hyperlipidemia 09/06/2014   Left groin hernia 01/17/2014    PCP: Garret Reddish  REFERRING PROVIDER: Lynne Leader  REFERRING DIAG: Lynne Leader  THERAPY DIAG:  Chronic right shoulder pain  Rationale for Evaluation and Treatment: Rehabilitation  ONSET DATE:   SUBJECTIVE:                                                                                                                                                                                      SUBJECTIVE STATEMENT: 08/03/2022: Pt states shoulder improving. Much less pain with reaching and elevation, still feeling painful with ER and IR.  Has f/u MD appt next week.   Eval: Pt with onset of R shoulder  pain about 4 months ago. States pain in arm, when Lifting out to the side and across body. He is R handed. He sits for work, retirement community, Press photographer. Computer work. States No pain at work.  Would like to start walking more, does not like to exercise.   PERTINENT HISTORY:  A fib   PAIN:  Are you having pain? Yes: NPRS scale: 2-3 /10 Pain location: R shoulder/arm Pain description: Sore, intermittent  Aggravating factors: lifting out to side  Relieving factors: rest  PRECAUTIONS: None  WEIGHT BEARING RESTRICTIONS: No  FALLS:  Has patient fallen in last 6 months? No  PLOF: Independent  PATIENT GOALS: decreased pain, increased movement    OBJECTIVE:   DIAGNOSTIC FINDINGS:    PATIENT SURVEYS :  FOTO   66.1   COGNITION: Overall cognitive status: Within functional limits for tasks assessed      POSTURE:   UPPER EXTREMITY ROM:  07/18/22:  PROM:  flex:    abd;         Active AROM Right eval Left eval R 07/11/22  Shoulder flexion 120 155 140  Shoulder extension     Shoulder abduction 90  130  Shoulder adduction     Shoulder internal rotation 25    Shoulder external rotation 45    Elbow flexion     Elbow extension     Wrist flexion     Wrist extension     Wrist ulnar deviation     Wrist radial deviation     Wrist pronation     Wrist supination     (Blank rows = not tested)  UPPER EXTREMITY MMT:  MMT Right eval Left eval R 07/18/22  Shoulder flexion 3+  4  Shoulder extension     Shoulder abduction 3-  4  Shoulder adduction     Shoulder internal rotation 4  4  Shoulder external rotation 4  4  Middle trapezius     Lower trapezius     Elbow flexion     Elbow extension     Wrist flexion     Wrist extension     Wrist ulnar deviation     Wrist radial deviation     Wrist pronation     Wrist supination     Grip strength (lbs)     (Blank rows = not tested)  SHOULDER SPECIAL TESTS:  JOINT MOBILITY TESTING:  Hypomobile R GHJ, significant  limitation for IR,  mod for ER and abd, mild for flexion   PALPATION:     TODAY'S TREATMENT:                                                                                                                                         DATE:   08/03/22: Therapeutic Exercise: Aerobic:  UBE 4 min bwd/fwd Supine:   Seated:  Standing:  Flex/full 2lb x 15;  abd to 90 deg x 10 2lb;  Shoulder ER  2 x 10 GTB:  Stretches:  IR behind back 2 hands 2 x 5;    supine  ER at 90 with stick x 25; wall angels x 15;  Neuromuscular Re-education: Manual Therapy: PROM R shoulder all motions-  Inf , ant, and post mobs; LAD;    07/27/22: Therapeutic Exercise: Aerobic:  UBE 4 min bwd/fwd Supine:   Seated:  Standing:  Rows GTB x 25;  Shoulder ER  2 x 10 GTB:  Stretches:  IR behind back with stick x 3 min;   doorway 15 sec x 3; ER at 90 with stick x 15;  Neuromuscular Re-education: Manual Therapy: PROM R shoulder all motions-  Inf , ant, and post mobs; LAD;  Contract relax for IR;  [place] mob with strap for IR (standing)   07/20/22: Therapeutic Exercise: Aerobic:   UBE 4 min bwd/fwd Supine:   Seated:  Standing:  Rows GTB x 25;  Shoulder ER and IR  2 x 10 GTB: AROM/scaption 2lb x 10,  abd x 10;  Stretches:  IR behind back with stick x 3 min;   self IR stretch-sleeper x 3 min in S/L Neuromuscular Re-education: Manual Therapy: PROM R shoulder all motions- focus on ER at 45 and 90 deg;  Inf and post mobs; LAD;  Contract relax for IR;  [place] mob with strap for IR (standing)   PATIENT EDUCATION: Education details: Reviewed  HEP Person educated: Patient Education method: Explanation, Demonstration, Tactile cues, Verbal cues, and Handouts Education comprehension: verbalized understanding, returned demonstration, verbal cues required, tactile cues required, and needs further education  HOME EXERCISE PROGRAM: Access Code: [id number]   ASSESSMENT:  CLINICAL IMPRESSION: 08/03/2022 Pt with much improved pain,  function, and ability for elevation. Doing very well with daily tasks. He does continue to have stiffness and mild pain at end ranges for ER and IR. IR with mild/moderate limitation, and ER with only mild limitation for ROM. Reviewed HEP for varying levels of ER for continuing to decrease joint stiffness. He is progressing well, will benefit from continued care at 1x/wk to restore full ROM.    Eval: Patient presents with primary complaint of increased pain in R shoulder. He has increased joint stiffness, and lack of full PROM and AROM. He has decreased strength and inability for full functional use of shoulder for lifting, reaching, ADLS and IADLs. Pt to benefit from skilled PT to improve deficits and pain.  OBJECTIVE IMPAIRMENTS: Abnormal gait, decreased activity tolerance, decreased mobility, decreased ROM, decreased strength, hypomobility, increased muscle spasms, impaired UE functional use, and pain.   ACTIVITY LIMITATIONS: carrying, lifting, dressing, reach over head, hygiene/grooming, and locomotion level  PARTICIPATION LIMITATIONS: meal prep, cleaning, laundry, driving, community activity, occupation, and yard work  PERSONAL FACTORS: Time since onset of injury/illness/exacerbation are also affecting patient's functional outcome.   REHAB POTENTIAL: Good  CLINICAL DECISION MAKING: Stable/uncomplicated  EVALUATION COMPLEXITY: Low  GOALS: Goals reviewed with patient? Yes   SHORT TERM GOALS: Target date: 07/13/2022   Pt to be independent with initial HEP  Goal status: MET  2.  Pt to demo improved PROM for flexion and IR,  by at least 10 deg,  Goal status: MET   LONG TERM GOALS: Target date: 08/24/2022   Pt to be independent with final HEP  Goal status: INITIAL  2.  Pt to demo improved AROM to be WNL to improve ability for ADLs.  Goal status: IN PROGRESS  3.  Pt to demo improved strength of R shoulder to at least 4/5 to improve ability for reach, lift, and IADLs.   Goal  status: IN PROGRESS   PLAN: PT FREQUENCY: 1-2x/week  PT DURATION: 8 weeks  PLANNED INTERVENTIONS: Therapeutic exercises, Therapeutic activity, Neuromuscular re-education, Balance training, Gait training, Patient/Family education, Self Care, Joint mobilization, Joint manipulation, Stair training, Aquatic Therapy, Dry Needling, Electrical stimulation, Spinal manipulation, Spinal mobilization, Cryotherapy, Moist heat, Taping, Traction, Ultrasound, Ionotophoresis 35m/ml Dexamethasone, and Manual therapy  PLAN FOR  NEXT SESSION:    Lyndee Hensen, PT, DPT 10:19 AM  08/03/22

## 2022-08-08 NOTE — Progress Notes (Unsigned)
   I, Peterson Lombard, LAT, ATC acting as a scribe for Lynne Leader, MD.  Casey Zavala is a 52 y.o. male who presents to [place] [facility] today for 6-wk f/u R shoulder pain. Pt was last seen by Dr. Georgina Snell on 06/28/22 and was referred to PT, completing 9 visits. Today, pt reports  Dx imaging: 06/28/22 R shoulder XR  Pertinent review of systems: ***  Relevant historical information: ***   Exam:  There were no vitals taken for this visit. General: Well Developed, well nourished, and in no acute distress.   MSK: ***    Lab and Radiology Results No results found for this or any previous visit (from the past 72 hour(s)). No results found.     Assessment and Plan: 52 y.o. male with ***   PDMP not reviewed this encounter. No orders of the defined types were placed in this encounter.  No orders of the defined types were placed in this encounter.    Discussed warning signs or symptoms. Please see discharge instructions. Patient expresses understanding.   ***

## 2022-08-09 ENCOUNTER — Encounter: Payer: Self-pay | Admitting: Family Medicine

## 2022-08-09 ENCOUNTER — Ambulatory Visit: Payer: 59 | Admitting: Family Medicine

## 2022-08-09 VITALS — BP 112/82 | HR 97 | Ht 72.0 in | Wt 193.4 lb

## 2022-08-09 DIAGNOSIS — M25511 Pain in right shoulder: Secondary | ICD-10-CM

## 2022-08-09 DIAGNOSIS — G8929 Other chronic pain: Secondary | ICD-10-CM | POA: Diagnosis not present

## 2022-08-09 NOTE — Patient Instructions (Signed)
Thank you for coming in today.   Continue the home exercises.   Return as needed.   I can re-auth PT in the future.

## 2022-08-10 ENCOUNTER — Ambulatory Visit: Payer: 59 | Admitting: Physical Therapy

## 2022-08-10 ENCOUNTER — Encounter: Payer: Self-pay | Admitting: Physical Therapy

## 2022-08-10 DIAGNOSIS — G8929 Other chronic pain: Secondary | ICD-10-CM

## 2022-08-10 DIAGNOSIS — M25511 Pain in right shoulder: Secondary | ICD-10-CM

## 2022-08-10 NOTE — Therapy (Signed)
OUTPATIENT PHYSICAL THERAPY UPPER EXTREMITY TREATMENT   Patient Name: Casey Zavala MRN: [id number] DOB:31-Dec-1970, 52 y.o., male Today's Date: 08/10/2022  END OF SESSION:  PT End of Session - 08/10/22 0804     Visit Number 10    Number of Visits 16    Date for PT Re-Evaluation 08/24/22    Authorization Type [employer]    PT Start Time 0805    PT Stop Time 0845    PT Time Calculation (min) 40 min    Activity Tolerance Patient tolerated treatment well    Behavior During Therapy WFL for tasks assessed/performed                  Past Medical History:  Diagnosis Date   Allergy    Atrial fibrillation ([place])    Basal cell carcinoma of skin    Hyperlipidemia    no meds   Left groin hernia    actually improved with exercise   Past Surgical History:  Procedure Laterality Date   ATRIAL FIBRILLATION ABLATION N/A 10/15/2021   Procedure: ATRIAL FIBRILLATION ABLATION;  Surgeon: Constance Haw, MD;  Location: [facility];  Service: Cardiovascular;  Laterality: N/A;   BASAL CELL CARCINOMA EXCISION     WISDOM TOOTH EXTRACTION     Patient Active Problem List   Diagnosis Date Noted   Paroxysmal atrial fibrillation ([place]) 03/12/2021   History of skin cancer 10/31/2017   Sinus node dysfunction ([place]) 09/06/2014   Hyperlipidemia 09/06/2014   Left groin hernia 01/17/2014    PCP: Garret Reddish  REFERRING PROVIDER: Lynne Leader  REFERRING DIAG: Lynne Leader  THERAPY DIAG:  Chronic right shoulder pain  Rationale for Evaluation and Treatment: Rehabilitation  ONSET DATE:   SUBJECTIVE:                                                                                                                                                                                      SUBJECTIVE STATEMENT: 08/10/2022:  Pt had f/u with MD this week. Doing well. Reports no pain, and is doing all regular activities.   Eval: Pt with onset of R shoulder pain about 4 months ago. States pain in  arm, when Lifting out to the side and across body. He is R handed. He sits for work, retirement community, Press photographer. Computer work. States No pain at work.  Would like to start walking more, does not like to exercise.   PERTINENT HISTORY:  A fib   PAIN:  Are you having pain? Yes: NPRS scale: 0-2 /10 Pain location: R shoulder/arm Pain description: Sore, intermittent  Aggravating factors: lifting out to side  Relieving factors: rest  PRECAUTIONS: None  WEIGHT BEARING RESTRICTIONS: No  FALLS:  Has patient fallen in last 6 months? No  PLOF: Independent  PATIENT GOALS: decreased pain, increased movement    OBJECTIVE:   DIAGNOSTIC FINDINGS:    PATIENT SURVEYS :  FOTO   66.1 ,    83.4  COGNITION: Overall cognitive status: Within functional limits for tasks assessed      POSTURE:   UPPER EXTREMITY ROM:  07/18/22:  PROM:  flex:    abd;         Active AROM Right eval Left eval R 07/11/22 R 08/10/22  Shoulder flexion 120 155 140 wfl  Shoulder extension      Shoulder abduction 90  130 wfl  Shoulder adduction      Shoulder internal rotation 25     Shoulder external rotation 45     Elbow flexion      Elbow extension      Wrist flexion      Wrist extension      Wrist ulnar deviation      Wrist radial deviation      Wrist pronation      Wrist supination      (Blank rows = not tested)  UPPER EXTREMITY MMT:  MMT Right eval Left eval R 07/18/22 R 08/10/22  Shoulder flexion 3+  4 4+  Shoulder extension      Shoulder abduction 3-  4 4+  Shoulder adduction      Shoulder internal rotation 4  4 4+  Shoulder external rotation 4  4 4+  Middle trapezius      Lower trapezius      Elbow flexion      Elbow extension      Wrist flexion      Wrist extension      Wrist ulnar deviation      Wrist radial deviation      Wrist pronation      Wrist supination      Grip strength (lbs)      (Blank rows = not tested)  SHOULDER SPECIAL TESTS:  JOINT MOBILITY TESTING:     PALPATION:     TODAY'S TREATMENT:                                                                                                                                         DATE:   08/03/22: Therapeutic Exercise: Aerobic:  UBE 4 min bwd/fwd Supine:   Seated:  Standing:  Flex/full 2lb x 15; ;  Shoulder ER  2 x 10 GTB:  Stretches:  Doorway for ER x 3 min; IR behind back 2 hands 4 x 5;    supine  ER at 90 with stick x 25; wall angels x 15;  Neuromuscular Re-education: Manual Therapy: PROM R shoulder all motions-  Inf and post mobs; LAD;     PATIENT EDUCATION: Education details: Reviewed  HEP Person educated: Patient Education method: Explanation, Demonstration, Tactile cues, Verbal cues, and Handouts Education comprehension: verbalized understanding, returned demonstration, verbal cues required, tactile cues required, and needs further education  HOME EXERCISE PROGRAM: Access Code: [id number]   ASSESSMENT:  CLINICAL IMPRESSION: 08/10/2022 Pt  has made very good progress. Has met goals at this time and is ready for d/c to HEP. Final HEP reviewed , discussed importance of continued mobility for regaining full end range motion. He has ROM WNL, but has mild limitation for IR behind the back. He has returned to all regular functional activity.    Eval: Patient presents with primary complaint of increased pain in R shoulder. He has increased joint stiffness, and lack of full PROM and AROM. He has decreased strength and inability for full functional use of shoulder for lifting, reaching, ADLS and IADLs. Pt to benefit from skilled PT to improve deficits and pain.  OBJECTIVE IMPAIRMENTS: Abnormal gait, decreased activity tolerance, decreased mobility, decreased ROM, decreased strength, hypomobility, increased muscle spasms, impaired UE functional use, and pain.   ACTIVITY LIMITATIONS: carrying, lifting, dressing, reach over head, hygiene/grooming, and locomotion level  PARTICIPATION  LIMITATIONS: meal prep, cleaning, laundry, driving, community activity, occupation, and yard work  PERSONAL FACTORS: Time since onset of injury/illness/exacerbation are also affecting patient's functional outcome.   REHAB POTENTIAL: Good  CLINICAL DECISION MAKING: Stable/uncomplicated  EVALUATION COMPLEXITY: Low  GOALS: Goals reviewed with patient? Yes   SHORT TERM GOALS: Target date: 07/13/2022   Pt to be independent with initial HEP  Goal status: MET  2.  Pt to demo improved PROM for flexion and IR,  by at least 10 deg,  Goal status: MET   LONG TERM GOALS: Target date: 08/24/2022   Pt to be independent with final HEP  Goal status: MET  2.  Pt to demo improved AROM to be WNL to improve ability for ADLs.  Goal status: MET  3.  Pt to demo improved strength of R shoulder to at least 4/5 to improve ability for reach, lift, and IADLs.   Goal status: MET   PLAN: PT FREQUENCY: 1-2x/week  PT DURATION: 8 weeks  PLANNED INTERVENTIONS: Therapeutic exercises, Therapeutic activity, Neuromuscular re-education, Balance training, Gait training, Patient/Family education, Self Care, Joint mobilization, Joint manipulation, Stair training, Aquatic Therapy, Dry Needling, Electrical stimulation, Spinal manipulation, Spinal mobilization, Cryotherapy, Moist heat, Taping, Traction, Ultrasound, Ionotophoresis 44m/ml Dexamethasone, and Manual therapy  PLAN FOR NEXT SESSION:    LLyndee Hensen PT, DPT 12:10 PM  08/10/22  PHYSICAL THERAPY DISCHARGE SUMMARY  Visits from Start of Care:10 Plan: Patient agrees to discharge.  Patient goals were met. Patient is being discharged due to meeting the stated rehab goals.     LLyndee Hensen PT, DPT 12:13 PM  08/10/22

## 2022-09-29 ENCOUNTER — Ambulatory Visit: Payer: 59 | Attending: Cardiology | Admitting: Cardiology

## 2022-09-29 ENCOUNTER — Encounter: Payer: Self-pay | Admitting: Cardiology

## 2022-09-29 VITALS — BP 128/84 | HR 92 | Ht 72.0 in | Wt 191.0 lb

## 2022-09-29 DIAGNOSIS — I480 Paroxysmal atrial fibrillation: Secondary | ICD-10-CM

## 2022-09-29 DIAGNOSIS — I4719 Other supraventricular tachycardia: Secondary | ICD-10-CM | POA: Diagnosis not present

## 2022-09-29 NOTE — Progress Notes (Signed)
Electrophysiology Office Note   Date:  09/29/2022   ID:  Casey Zavala, DOB 09/25/1970, MRN [id number]  PCP:  Shelva MajesticHunter, Stephen O, MD  Cardiologist:  Rennis GoldenHilty Primary Electrophysiologist:  Kelita Wallis Jorja LoaMartin Jonette Wassel, MD    Chief Complaint: AF   History of Present Illness: Casey Zavala is a 52 y.o. male who is being seen today for the evaluation of AF at the request of Shelva MajesticHunter, Stephen O, MD. Presenting today for electrophysiology evaluation.  He has a history of significant atrial fibrillation and atrial tachycardia.  He was diagnosed with atrial fibrillation November 2021 by his smart watch.  He was noted to have more frequent episodes of atrial fibrillation and is now status post ablation 10/15/2021.  Today, denies symptoms of palpitations, chest pain, shortness of breath, orthopnea, PND, lower extremity edema, claudication, dizziness, presyncope, syncope, bleeding, or neurologic sequela. The patient is tolerating medications without difficulties.  He is currently feeling well.  He is not having any chest pain or shortness of breath.  He has noted no further episodes of atrial fibrillation.  His heart rates have been a little bit more rapid over the last few months.  He was initially on beta-blockers, but these were stopped after his ablation.  They have been restarted and now his heart rates are in the 80s to 90s.  He is not symptomatic but is wondering if there is any benefit to further rhythm control.   Past Medical History:  Diagnosis Date   Allergy    Atrial fibrillation    Basal cell carcinoma of skin    Hyperlipidemia    no meds   Left groin hernia    actually improved with exercise   Past Surgical History:  Procedure Laterality Date   ATRIAL FIBRILLATION ABLATION N/A 10/15/2021   Procedure: ATRIAL FIBRILLATION ABLATION;  Surgeon: Regan Lemmingamnitz, Jove Beyl Martin, MD;  Location: [facility];  Service: Cardiovascular;  Laterality: N/A;   BASAL CELL CARCINOMA EXCISION     WISDOM  TOOTH EXTRACTION       Current Outpatient Medications  Medication Sig Dispense Refill   busPIRone (BUSPAR) 5 MG tablet Take 1 tablet (5 mg total) by mouth 2 (two) times daily as needed (anxiety with travel). 10 tablet 0   carvedilol (COREG) 6.25 MG tablet Take 1 tablet (6.25 mg total) by mouth 2 (two) times daily. 180 tablet 3   psyllium (METAMUCIL) 58.6 % powder Take 1 packet by mouth at bedtime.     Red Yeast Rice Extract (RED YEAST RICE PO) Take 2 capsules by mouth 2 (two) times daily.     sildenafil (VIAGRA) 100 MG tablet Take 1 tablet (100 mg total) by mouth daily as needed for erectile dysfunction. 10 tablet 5   No current facility-administered medications for this visit.    Allergies:   Eliquis [apixaban]   Social History:  The patient  reports that he has never smoked. He has never used smokeless tobacco. He reports current alcohol use of about 2.0 standard drinks of alcohol per week. He reports that he does not use drugs.   Family History:  The patient's family history includes Dementia in his maternal grandfather; Depression in his sister; Diabetes in his brother, maternal grandmother, and mother; Heart attack in his paternal grandmother; Hypothyroidism in his mother; Kidney disease in his brother; Lung cancer in his paternal grandfather; Schizophrenia in his brother; Stroke in his father.   ROS:  Please see the history of present illness.   Otherwise, review of  systems is positive for none.   All other systems are reviewed and negative.   PHYSICAL EXAM: VS:  BP 128/84   Pulse 92   Ht 6' (1.829 m)   Wt 191 lb (86.6 kg)   SpO2 96%   BMI 25.90 kg/m  , BMI Body mass index is 25.9 kg/m. GEN: Well nourished, well developed, in no acute distress  HEENT: normal  Neck: no JVD, carotid bruits, or masses Cardiac: RRR; no murmurs, rubs, or gallops,no edema  Respiratory:  clear to auscultation bilaterally, normal work of breathing GI: soft, nontender, nondistended, + BS MS: no  deformity or atrophy  Skin: warm and dry Neuro:  Strength and sensation are intact Psych: euthymic mood, full affect  EKG:  EKG is not ordered today. Personal review of the ekg ordered 07/21/22 shows sinus tachycardia   Recent Labs: 06/14/2022: ALT 17; BUN 11; Creatinine, Ser 0.91; Hemoglobin 13.6; Platelets 378.0; Potassium 4.6; Sodium 140; TSH 0.99    Lipid Panel     Component Value Date/Time   CHOL 173 06/14/2022 0847   CHOL 251 (H) 05/19/2020 0933   TRIG 94.0 06/14/2022 0847   HDL 47.60 06/14/2022 0847   HDL 74 05/19/2020 0933   CHOLHDL 4 06/14/2022 0847   VLDL 18.8 06/14/2022 0847   LDLCALC 106 (H) 06/14/2022 0847   LDLCALC 163 (H) 05/19/2020 0933     Wt Readings from Last 3 Encounters:  09/29/22 191 lb (86.6 kg)  08/09/22 193 lb 6.4 oz (87.7 kg)  07/21/22 188 lb 3.2 oz (85.4 kg)      Other studies Reviewed: Additional studies/ records that were reviewed today include: TTE 04/13/21  Review of the above records today demonstrates:   1. Left ventricular ejection fraction, by estimation, is 45 to 50%. The  left ventricle has mildly decreased function. The left ventricle has no  regional wall motion abnormalities. Left ventricular diastolic parameters  are indeterminate.   2. Right ventricular systolic function is normal. The right ventricular  size is normal. Tricuspid regurgitation signal is inadequate for assessing  PA pressure.   3. The mitral valve is normal in structure. No evidence of mitral valve  regurgitation. No evidence of mitral stenosis.   4. The aortic valve has an indeterminant number of cusps. Aortic valve  regurgitation is not visualized. No aortic stenosis is present.   5. There is borderline dilatation of the ascending aorta, measuring 37  mm.   6. The inferior vena cava is normal in size with greater than 50%  respiratory variability, suggesting right atrial pressure of 3 mmHg.    ASSESSMENT AND PLAN:  1.  Paroxysmal atrial fibrillation:  CHA2DS2-VASc of 0.  Status post ablation 10/15/2021.  He has had no further episodes of atrial fibrillation.  Casey Zavala continue with current management.  2.  Atrial tachycardia: He brings in Apple Watch recordings that show heart rates generally in the 90s to low 100s.  He did have a week with heart rates in the 60s.  He is asymptomatic from this.  He continues to exercise without issue.  He is able to do all of his daily activities and does not have palpitations, shortness of breath, or fatigue.  Due to that, we Casey Zavala continue to monitor.  No indication for procedures at this time.  Current medicines are reviewed at length with the patient today.   The patient does not have concerns regarding his medicines.  The following changes were made today:  none  Labs/ tests ordered today  include:  No orders of the defined types were placed in this encounter.    Disposition:   FU 12 months  Signed, Casey Coley Jorja Loa, MD  09/29/2022 4:32 PM     [facility] [address] [place] [address] [phone] (office) [phone] (fax)

## 2022-10-03 ENCOUNTER — Encounter: Payer: Self-pay | Admitting: Family Medicine

## 2022-10-12 ENCOUNTER — Ambulatory Visit (INDEPENDENT_AMBULATORY_CARE_PROVIDER_SITE_OTHER): Payer: 59

## 2022-10-12 DIAGNOSIS — Z23 Encounter for immunization: Secondary | ICD-10-CM

## 2023-04-27 ENCOUNTER — Other Ambulatory Visit: Payer: Self-pay | Admitting: Cardiology

## 2023-06-12 ENCOUNTER — Encounter: Payer: 59 | Admitting: Family Medicine

## 2023-09-25 ENCOUNTER — Encounter: Payer: Self-pay | Admitting: Family Medicine

## 2023-09-25 ENCOUNTER — Ambulatory Visit: Admitting: Family Medicine

## 2023-09-25 VITALS — BP 122/68 | HR 86 | Temp 97.9°F | Ht 72.0 in | Wt 191.8 lb

## 2023-09-25 DIAGNOSIS — I480 Paroxysmal atrial fibrillation: Secondary | ICD-10-CM

## 2023-09-25 DIAGNOSIS — I495 Sick sinus syndrome: Secondary | ICD-10-CM

## 2023-09-25 DIAGNOSIS — N529 Male erectile dysfunction, unspecified: Secondary | ICD-10-CM

## 2023-09-25 DIAGNOSIS — R6882 Decreased libido: Secondary | ICD-10-CM

## 2023-09-25 DIAGNOSIS — R300 Dysuria: Secondary | ICD-10-CM

## 2023-09-25 DIAGNOSIS — E785 Hyperlipidemia, unspecified: Secondary | ICD-10-CM

## 2023-09-25 DIAGNOSIS — Z131 Encounter for screening for diabetes mellitus: Secondary | ICD-10-CM

## 2023-09-25 DIAGNOSIS — Z125 Encounter for screening for malignant neoplasm of prostate: Secondary | ICD-10-CM | POA: Diagnosis not present

## 2023-09-25 DIAGNOSIS — E663 Overweight: Secondary | ICD-10-CM

## 2023-09-25 LAB — POC URINALSYSI DIPSTICK (AUTOMATED)
Bilirubin, UA: NEGATIVE
Blood, UA: NEGATIVE
Glucose, UA: NEGATIVE
Ketones, UA: NEGATIVE
Protein, UA: NEGATIVE
Spec Grav, UA: 1.01 (ref 1.010–1.025)
Urobilinogen, UA: 1 U/dL
pH, UA: 6.5 (ref 5.0–8.0)

## 2023-09-25 MED ORDER — CEPHALEXIN 500 MG PO CAPS: 500.0000 mg | ORAL_CAPSULE | Freq: Four times a day (QID) | ORAL | 0 refills | Status: DC

## 2023-09-25 NOTE — Progress Notes (Signed)
 Phone [phone] In person visit   Subjective:   Casey Zavala is a 53 y.o. year old very pleasant male patient who presents for/with See problem oriented charting Chief Complaint  Patient presents with   Dysuria   Past Medical History-  Patient Active Problem List   Diagnosis Date Noted   Paroxysmal atrial fibrillation (HCC) 03/12/2021    Priority: High   History of skin cancer 10/31/2017    Priority: Low   Sinus node dysfunction (HCC) 09/06/2014    Priority: Low   Hyperlipidemia 09/06/2014    Priority: Low   Left groin hernia 01/17/2014    Priority: Low    Medications- reviewed and updated Current Outpatient Medications  Medication Sig Dispense Refill   busPIRone (BUSPAR) 5 MG tablet Take 1 tablet (5 mg total) by mouth 2 (two) times daily as needed (anxiety with travel). 10 tablet 0   carvedilol (COREG) 6.25 MG tablet TAKE 1 TABLET BY MOUTH TWICE A DAY 180 tablet 1   cephALEXin (KEFLEX) 500 MG capsule Take 1 capsule (500 mg total) by mouth 4 (four) times daily for 7 days. 28 capsule 0   psyllium (METAMUCIL) 58.6 % powder Take 1 packet by mouth at bedtime.     Red Yeast Rice Extract (RED YEAST RICE PO) Take 2 capsules by mouth 2 (two) times daily.     sildenafil (VIAGRA) 100 MG tablet Take 1 tablet (100 mg total) by mouth daily as needed for erectile dysfunction. 10 tablet 5   No current facility-administered medications for this visit.     Objective:  BP 122/68   Pulse 86   Temp 97.9 F (36.6 C)   Ht 6' (1.829 m)   Wt 191 lb 12.8 oz (87 kg)   SpO2 98%   BMI 26.01 kg/m  Gen: NAD, resting comfortably CV: RRR no murmurs rubs or gallops Lungs: CTAB no crackles, wheeze, rhonchi Skin: warm, dry Rectal: normal tone, normal sized prostate, no masses or tenderness  Results for orders placed or performed in visit on 09/25/23 (from the past 24 hours)  POCT Urinalysis Dipstick (Automated)     Status: Abnormal   Collection Time: 09/25/23 11:28 AM  Result Value  Ref Range   Color, UA Amber    Clarity, UA clear    Glucose, UA Negative Negative   Bilirubin, UA negative    Ketones, UA negative    Spec Grav, UA 1.010 1.010 - 1.025   Blood, UA negative    pH, UA 6.5 5.0 - 8.0   Protein, UA Negative Negative   Urobilinogen, UA 1.0 0.2 or 1.0 E.U./dL   Nitrite, UA +    Leukocytes, UA Small (1+) (A) Negative       Assessment and Plan   #social update- wrapping up work likely end of this year. Spending a lot more time with wife and family which has been good. Low libido though. Denies depression. Low libido and prior erectile dysfunction (ED) issues have been traumatic though. No major hobbies/interests - not really sure what his purpose is in next phase of life but planning on travel/family time  #Concern for UTI S: Patients symptoms started around 3-4 pm with dysuria- though maybe tip of penis was irritated rubbing on pants - but didn't see any irritation. Started cranberry juice and started AZO. Urine slightly darker than usual but took AZO.  -history of prostatitis years ago- also saw urology- had been on antibiotics in past. No issues since then Complains of dysuria: yes; polyuria:  yes but no different than usual; nocturia: none; urgency: none. No penile discharge. No fever. May have been more dehydrated with working outdoors more. No amber color to urine- no visible blood Symptoms are worsening at least into this morning before AZO. Only active with wife and activity has been low lately.  ROS- no fever, chills, nausea, vomiting, flank pain. No blood in urine.  A/P: UA  with leukocytes. Likely UTI. Will get culture. Empiric treatment with: keflex 4x a day - no evidence of prostatitis on exam Patient to follow up if new or worsening symptoms or failure to improve.    #Low libido- able to get erection with sildenafil but desire is very low. Able to still get erection and does still get morning erections but less frequently . Work stress has been  better lately. No stress in relationship - will check testosterone before Comprehensive Physical Exam (CPE) preventive care annual visit along with other labs as below -carvedilol could affect erectile dysfunction (ED)/libido  Recommended follow up: Return for next already scheduled visit or sooner if needed. Future Appointments  Date Time Provider Department Center  09/29/2023  9:00 AM Shelva Majestic, MD [facility]    Lab/Order associations:   ICD-10-CM   1. Dysuria  R30.0 POCT Urinalysis Dipstick (Automated)    Urine Culture    CANCELED: POCT UA - Glucose/Protein    CANCELED: Urinalysis    2. Sinus node dysfunction (HCC)  I49.5     3. Paroxysmal atrial fibrillation (HCC)  I48.0     4. Screening for prostate cancer  Z12.5 PSA    5. Screening for diabetes mellitus  Z13.1 Hemoglobin A1c    6. Overweight  E66.3 Hemoglobin A1c    7. Hyperlipidemia, unspecified hyperlipidemia type  E78.5 Comprehensive metabolic panel with GFR    CBC with Differential/Platelet    Lipid panel    TSH    8. Low libido  R68.82 Testosterone    9. Erectile dysfunction, unspecified erectile dysfunction type  N52.9 Testosterone      Meds ordered this encounter  Medications   cephALEXin (KEFLEX) 500 MG capsule    Sig: Take 1 capsule (500 mg total) by mouth 4 (four) times daily for 7 days.    Dispense:  28 capsule    Refill:  0   Time Spent: 33 minutes of total time (11:30 AM- 12:03 PM) was spent on the date of the encounter performing the following actions:  brief chart review prior to seeing the patient, obtaining history, performing a medically necessary exam, counseling on the treatment plan and reasoning for why urinary tract infection instead of prostatitis, placing orders, and documenting in our EHR.    Return precautions advised.  Tana Conch, MD

## 2023-09-25 NOTE — Patient Instructions (Addendum)
 [facility] [address] [place], [place] [address] [phone]  I suggest myfitnesspal Use 0.5 pounds per week weight loss goal (or up to 1 pounds if needed) Set a reasonable goal such as 5-10 lbs and can reset goal once you reach the goal Do not connect your step counter to this- watch or phone Update me in 2-3 months with how you are doing  UA  with leukocytes. Likely UTI. Will get culture. Empiric treatment with: keflex 4x a day - no evidence of prostatitis on exam Patient to follow up if new or worsening symptoms or failure to improve.   Recommended follow up: Return for next already scheduled visit or sooner if needed. -come by for your appointment around 8 30 or 8 35 and have blood drawn before appointment as has to be fasting between 8-9 am

## 2023-09-26 LAB — URINE CULTURE
MICRO NUMBER:: 16297290
Result:: NO GROWTH
SPECIMEN QUALITY:: ADEQUATE

## 2023-09-27 ENCOUNTER — Encounter: Payer: Self-pay | Admitting: Family Medicine

## 2023-09-29 ENCOUNTER — Encounter: Payer: Self-pay | Admitting: Family Medicine

## 2023-09-29 ENCOUNTER — Ambulatory Visit (INDEPENDENT_AMBULATORY_CARE_PROVIDER_SITE_OTHER): Payer: 59 | Admitting: Family Medicine

## 2023-09-29 DIAGNOSIS — Z125 Encounter for screening for malignant neoplasm of prostate: Secondary | ICD-10-CM

## 2023-09-29 DIAGNOSIS — R6882 Decreased libido: Secondary | ICD-10-CM | POA: Diagnosis not present

## 2023-09-29 DIAGNOSIS — E663 Overweight: Secondary | ICD-10-CM

## 2023-09-29 DIAGNOSIS — Z131 Encounter for screening for diabetes mellitus: Secondary | ICD-10-CM | POA: Diagnosis not present

## 2023-09-29 DIAGNOSIS — N529 Male erectile dysfunction, unspecified: Secondary | ICD-10-CM | POA: Diagnosis not present

## 2023-09-29 DIAGNOSIS — E785 Hyperlipidemia, unspecified: Secondary | ICD-10-CM

## 2023-09-29 LAB — CBC WITH DIFFERENTIAL/PLATELET
Basophils Absolute: 0 10*3/uL (ref 0.0–0.1)
Basophils Relative: 0.6 % (ref 0.0–3.0)
Eosinophils Absolute: 0.1 10*3/uL (ref 0.0–0.7)
Eosinophils Relative: 2.7 % (ref 0.0–5.0)
HCT: 42.8 % (ref 39.0–52.0)
Hemoglobin: 14.2 g/dL (ref 13.0–17.0)
Lymphocytes Relative: 28.8 % (ref 12.0–46.0)
Lymphs Abs: 1.4 10*3/uL (ref 0.7–4.0)
MCHC: 33.1 g/dL (ref 30.0–36.0)
MCV: 87.2 fl (ref 78.0–100.0)
Monocytes Absolute: 0.4 10*3/uL (ref 0.1–1.0)
Monocytes Relative: 8.2 % (ref 3.0–12.0)
Neutro Abs: 2.9 10*3/uL (ref 1.4–7.7)
Neutrophils Relative %: 59.7 % (ref 43.0–77.0)
Platelets: 294 10*3/uL (ref 150.0–400.0)
RBC: 4.91 Mil/uL (ref 4.22–5.81)
RDW: 13 % (ref 11.5–15.5)
WBC: 4.9 10*3/uL (ref 4.0–10.5)

## 2023-09-29 LAB — COMPREHENSIVE METABOLIC PANEL WITH GFR
ALT: 20 U/L (ref 0–53)
AST: 21 U/L (ref 0–37)
Albumin: 4.7 g/dL (ref 3.5–5.2)
Alkaline Phosphatase: 46 U/L (ref 39–117)
BUN: 15 mg/dL (ref 6–23)
CO2: 29 meq/L (ref 19–32)
Calcium: 9.4 mg/dL (ref 8.4–10.5)
Chloride: 103 meq/L (ref 96–112)
Creatinine, Ser: 0.89 mg/dL (ref 0.40–1.50)
GFR: 98.19 mL/min (ref >60.00)
Glucose, Bld: 111 mg/dL — ABNORMAL HIGH (ref 70–99)
Potassium: 5 meq/L (ref 3.5–5.1)
Sodium: 140 meq/L (ref 135–145)
Total Bilirubin: 0.5 mg/dL (ref 0.2–1.2)
Total Protein: 7.1 g/dL (ref 6.0–8.3)

## 2023-09-29 LAB — LIPID PANEL
Cholesterol: 179 mg/dL (ref 0–200)
HDL: 54.2 mg/dL (ref >39.00)
LDL Cholesterol: 114 mg/dL — ABNORMAL HIGH (ref 0–99)
NonHDL: 124.64
Total CHOL/HDL Ratio: 3
Triglycerides: 51 mg/dL (ref 0.0–149.0)
VLDL: 10.2 mg/dL (ref 0.0–40.0)

## 2023-09-29 LAB — PSA: PSA: 1.9 ng/mL (ref 0.10–4.00)

## 2023-09-29 LAB — TESTOSTERONE: Testosterone: 494.35 ng/dL (ref 300.00–890.00)

## 2023-09-29 LAB — HEMOGLOBIN A1C: Hgb A1c MFr Bld: 6 % (ref 4.6–6.5)

## 2023-09-29 LAB — TSH: TSH: 1.31 u[IU]/mL (ref 0.35–5.50)

## 2023-09-29 MED ORDER — BUSPIRONE HCL 5 MG PO TABS: 5.0000 mg | ORAL_TABLET | Freq: Two times a day (BID) | ORAL | 0 refills | Status: AC | PRN

## 2023-09-29 NOTE — Progress Notes (Signed)
 Phone: [phone]   Subjective:  Patient presents today for their annual physical. Chief complaint-noted.   See problem oriented charting- ROS- full  review of systems was completed and negative  except for: Dysuria earlier this week  The following were reviewed and entered/updated in epic: Past Medical History:  Diagnosis Date   Allergy    Atrial fibrillation (HCC)    Basal cell carcinoma of skin    Hyperlipidemia    no meds   Left groin hernia    actually improved with exercise   Patient Active Problem List   Diagnosis Date Noted   Paroxysmal atrial fibrillation (HCC) 03/12/2021    Priority: High   History of skin cancer 10/31/2017    Priority: Low   Sinus node dysfunction (HCC) 09/06/2014    Priority: Low   Hyperlipidemia 09/06/2014    Priority: Low   Left groin hernia 01/17/2014    Priority: Low   Past Surgical History:  Procedure Laterality Date   ATRIAL FIBRILLATION ABLATION N/A 10/15/2021   Procedure: ATRIAL FIBRILLATION ABLATION;  Surgeon: Regan Lemming, MD;  Location: [facility];  Service: Cardiovascular;  Laterality: N/A;   BASAL CELL CARCINOMA EXCISION     WISDOM TOOTH EXTRACTION      Family History  Problem Relation Age of Onset   Hypothyroidism Mother        also dementia   Diabetes Mother    Stroke Father    Depression Sister    Diabetes Brother    Schizophrenia Brother    Kidney disease Brother    Diabetes Maternal Grandmother    Dementia Maternal Grandfather    Heart attack Paternal Grandmother    Lung cancer Paternal Grandfather    Colon cancer Neg Hx    Colon polyps Neg Hx    Esophageal cancer Neg Hx    Rectal cancer Neg Hx    Stomach cancer Neg Hx     Medications- reviewed and updated Current Outpatient Medications  Medication Sig Dispense Refill   carvedilol (COREG) 6.25 MG tablet TAKE 1 TABLET BY MOUTH TWICE A DAY 180 tablet 1   psyllium (METAMUCIL) 58.6 % powder Take 1 packet by mouth at bedtime.     Red Yeast  Rice Extract (RED YEAST RICE PO) Take 2 capsules by mouth 2 (two) times daily.     sildenafil (VIAGRA) 100 MG tablet Take 1 tablet (100 mg total) by mouth daily as needed for erectile dysfunction. 10 tablet 5   busPIRone (BUSPAR) 5 MG tablet Take 1 tablet (5 mg total) by mouth 2 (two) times daily as needed (anxiety with travel). 10 tablet 0   No current facility-administered medications for this visit.    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 Hoping to retire around 87   Quaker background      Hobbies: exercising   Objective  Objective:  BP 110/80   Pulse 82   Temp 97.7 F (36.5 C)   Ht 6' (1.829 m)   Wt 192 lb 9.6 oz (87.4 kg)   SpO2 96%   BMI 26.12 kg/m  Gen: NAD, resting comfortably HEENT: Mucous membranes are moist. Oropharynx normal Neck: no thyromegaly CV: RRR no murmurs rubs or gallops Lungs: CTAB  no crackles, wheeze, rhonchi Abdomen: soft/nontender/nondistended/normal bowel sounds. No rebound or guarding.  Ext: no edema Skin: warm, dry Neuro: grossly normal, moves all extremities, PERRLA   Assessment and Plan  53 y.o. male presenting for annual physical.  Health Maintenance counseling: 1. Anticipatory guidance: Patient counseled regarding regular dental exams -q6 months, eye exams -yearly,  avoiding smoking and second hand smoke , limiting alcohol to 2 beverages per day - 2-3 a week, no illicit drugs .   2. Risk factor reduction:  Advised patient of need for regular exercise and diet rich and fruits and vegetables to reduce risk of heart attack and stroke.  Exercise-has struggled with motivation- working to turn this around.  Diet/weight management-weight up 7 pounds in the last year- wants to go back to food  journaling- wants to get to around 170 on home weights.  Wt Readings from Last 3 Encounters:  09/29/23 192 lb 9.6 oz (87.4 kg)  09/25/23 191 lb 12.8 oz (87 kg)  09/29/22 191 lb (86.6 kg)  3. Immunizations/screenings/ancillary studies-fully up-to-date other than holding off on COVID vaccination  Immunization History  Administered Date(s) Administered   Influenza Split 05/31/2011   Influenza Whole 04/02/2008, 04/22/2009   Influenza,inj,Quad PF,6+ Mos 05/27/2019, 06/10/2022   Influenza-Unspecified 04/20/2017, 04/23/2018, 05/01/2020, 05/20/2021   Moderna Sars-Covid-2 Vaccination 06/24/2019, 07/22/2019, 05/22/2020   Td 06/20/1996, 02/09/2007   Tdap 05/17/2017   Zoster Recombinant(Shingrix) 06/10/2022, 10/12/2022  4. Prostate cancer screening- repeat PSA has already been ordered today-we will trend Lab Results  Component Value Date   PSA 1.30 06/14/2022   5. Colon cancer screening - 04/16/2021 with 10-year repeat colonoscopy 6. Skin cancer screening-sees Dr. Karlyn Agee wit history skin cancer. advised regular sunscreen use. Denies worrisome, changing, or new skin lesions.  7. Smoking associated screening (lung cancer screening, AAA screen 65-75, UA)- never smoker 8. STD screening - only active with wife  Status of chronic or acute concerns   #social update- trip to [place] week after next with wife and may do sex therapy later  # Dysuria-initial urine test on 09/25/2023 suggested UTI with positive nitrites and 1+ leukocytes.  Rectal exam was reassuring.  Urine culture showed no UTI.We originally prescribed Keflex but told him that he could stop after we got results back- he did stop- symptoms had resolved within a day and have not recurred.   # Low libido-ongoing issues-we are checking testosterone.  Able to get erection with sildenafil but desires still very low even with this.  Less frequent morning erections.  Work stress has been better.  No stress in relationship with his wife.  Carvedilol  could be affecting erectile dysfunction and libido.  And also discussed potential sex therapy   # Paroxysmal Atrial fibrillation- s/p ablation 10/15/21- follows with DR. Hochrein in [place] S: Rate controlled with carvedilol 6.25 mg BID (tachycardia non a fib related even after procedure) Anticoagulated with  no rx due to low CHA2DS2-VASc and rash on eliquis -Does have atrial tachycardia at times- no issues lately  A/P: rate controled- not obviously recurrent since ablation.  Not on anticoagulant- low chadsvasc plus rash on eliquis  #hyperlipidemia S: Medication: red yeast rice-Metamucil may help as well- health coach had recommended Lab Results  Component Value Date   CHOL 173 06/14/2022   HDL 47.60 06/14/2022   LDLCALC 106 (H) 06/14/2022   TRIG 94.0 06/14/2022   CHOLHDL 4 06/14/2022   A/P: lipids hopefully stable or improved- update levels today- already drew labs  # Stress/anxiety S:medication: buspirone  5 mg twice daily    -only needing with flight- now- work stress much better  A/P: needs refill today for upcoming travel- sent this in    Recommended follow up: Return in about 1 year (around 09/28/2024) for physical or sooner if needed.Schedule b4 you leave.  Lab/Order associations: fasting- already drawn   ICD-10-CM   1. Low libido  R68.82 Testosterone    2. Erectile dysfunction, unspecified erectile dysfunction type  N52.9 Testosterone    3. Hyperlipidemia, unspecified hyperlipidemia type  E78.5 TSH    Lipid panel    CBC with Differential/Platelet    Comprehensive metabolic panel with GFR    4. Screening for prostate cancer  Z12.5 PSA    5. Screening for diabetes mellitus  Z13.1 Hemoglobin A1c    6. Overweight  E66.3 Hemoglobin A1c      Meds ordered this encounter  Medications   busPIRone (BUSPAR) 5 MG tablet    Sig: Take 1 tablet (5 mg total) by mouth 2 (two) times daily as needed (anxiety with travel).    Dispense:  10 tablet    Refill:  0    Return  precautions advised.  Tana Conch, MD

## 2023-09-29 NOTE — Patient Instructions (Addendum)
 Exercise prescription- 150 minutes per week exercise with something you enjoy and can maintain -consider atomic habits  Restart food journaling  Text jayhawk back!   Recommended follow up: Return in about 1 year (around 09/28/2024) for physical or sooner if needed.Schedule b4 you leave.

## 2023-10-03 ENCOUNTER — Other Ambulatory Visit: Payer: Self-pay

## 2023-10-03 DIAGNOSIS — R972 Elevated prostate specific antigen [PSA]: Secondary | ICD-10-CM

## 2023-10-25 ENCOUNTER — Other Ambulatory Visit: Payer: Self-pay | Admitting: Cardiology

## 2023-10-30 ENCOUNTER — Telehealth: Payer: Self-pay | Admitting: Cardiology

## 2023-10-30 MED ORDER — CARVEDILOL 6.25 MG PO TABS: 6.2500 mg | ORAL_TABLET | Freq: Two times a day (BID) | ORAL | 0 refills | Status: DC

## 2023-10-30 NOTE — Telephone Encounter (Signed)
*  STAT* If patient is at the pharmacy, call can be transferred to refill team.   1. Which medications need to be refilled? (please list name of each medication and dose if known) carvedilol  (COREG ) 6.25 MG tablet   2. Which pharmacy/location (including street and city if local pharmacy) is medication to be sent to?  [id number] - [place], [place] - [address]      3. Do they need a 30 day or 90 day supply? 90 day

## 2023-11-01 ENCOUNTER — Other Ambulatory Visit: Payer: Self-pay | Admitting: Cardiology

## 2023-11-02 ENCOUNTER — Other Ambulatory Visit: Payer: Self-pay | Admitting: Cardiology

## 2023-11-02 ENCOUNTER — Other Ambulatory Visit: Payer: Self-pay

## 2023-11-02 DIAGNOSIS — I480 Paroxysmal atrial fibrillation: Secondary | ICD-10-CM

## 2023-11-02 MED ORDER — CARVEDILOL 6.25 MG PO TABS: 6.2500 mg | ORAL_TABLET | Freq: Two times a day (BID) | ORAL | 0 refills | Status: DC

## 2023-11-02 NOTE — Telephone Encounter (Signed)
 Pt went by pharmacy yesterday and they do not have script. Can we re-send?

## 2023-11-02 NOTE — Telephone Encounter (Signed)
 Rx sent to pharmacy. Patient aware.

## 2023-11-02 NOTE — Telephone Encounter (Signed)
 Patient called because the pharmacy stated the medication is still getting rejected. Patient is requesting for a call back to speak with someone in regard to this. Please advise.

## 2023-11-02 NOTE — Addendum Note (Signed)
 Addended by: Zeb Heys on: 11/02/2023 08:19 AM   Modules accepted: Orders

## 2023-11-02 NOTE — Progress Notes (Signed)
 Rx resent to pharmacy

## 2023-11-03 ENCOUNTER — Other Ambulatory Visit: Payer: Self-pay | Admitting: Cardiology

## 2023-11-03 ENCOUNTER — Other Ambulatory Visit: Payer: Self-pay

## 2023-11-03 DIAGNOSIS — I480 Paroxysmal atrial fibrillation: Secondary | ICD-10-CM

## 2023-11-03 MED ORDER — CARVEDILOL 6.25 MG PO TABS: 6.2500 mg | ORAL_TABLET | Freq: Two times a day (BID) | ORAL | 1 refills | Status: DC

## 2023-11-03 NOTE — Telephone Encounter (Signed)
 Pt's medication was resent to pt's preferred pharmacy. The first Rx was on print, so it did not go to pt's pharmacy. Confirmation for the resent Rx received.

## 2023-11-25 ENCOUNTER — Other Ambulatory Visit: Payer: Self-pay | Admitting: Cardiology

## 2023-11-25 DIAGNOSIS — I480 Paroxysmal atrial fibrillation: Secondary | ICD-10-CM

## 2023-11-28 ENCOUNTER — Encounter: Payer: Self-pay | Admitting: Cardiology

## 2023-11-28 ENCOUNTER — Ambulatory Visit: Attending: Cardiology | Admitting: Cardiology

## 2023-11-28 VITALS — BP 118/68 | HR 100 | Ht 72.0 in | Wt 190.0 lb

## 2023-11-28 DIAGNOSIS — I480 Paroxysmal atrial fibrillation: Secondary | ICD-10-CM | POA: Diagnosis not present

## 2023-11-28 DIAGNOSIS — I4719 Other supraventricular tachycardia: Secondary | ICD-10-CM

## 2023-11-28 NOTE — Progress Notes (Signed)
  Electrophysiology Office Note:   Date:  11/28/2023  ID:  Casey Zavala, DOB 16-Aug-1970, MRN [id number]  Primary Cardiologist: None Primary Heart Failure: None Electrophysiologist: Yuvia Plant Cortland Ding, MD      History of Present Illness:   Casey Zavala is a 53 y.o. male with h/o atrial fibrillation, atrial tachycardia seen today for routine electrophysiology followup.   Since last being seen in our clinic the patient reports doing well.  He has noted no further episodes of atrial fibrillation.  His heart rate continues to be somewhat fast but he is completely asymptomatic.  He is tolerating his medications without issue..  he denies chest pain, palpitations, dyspnea, PND, orthopnea, nausea, vomiting, dizziness, syncope, edema, weight gain, or early satiety.   Review of systems complete and found to be negative unless listed in HPI.   EP Information / Studies Reviewed:    EKG is ordered today. Personal review as below.  EKG Interpretation Date/Time:  Tuesday November 28 2023 09:26:12 EDT Ventricular Rate:  100 PR Interval:  72 QRS Duration:  80 QT Interval:  338 QTC Calculation: 436 R Axis:   54  Text Interpretation: Sinus rhythm with short PR When compared with ECG of 21-Jul-2022 08:27, PR interval has decreased Confirmed by Elexius Minar ([id number]) on 11/28/2023 9:44:12 AM     Risk Assessment/Calculations:    CHA2DS2-VASc Score = 0   This indicates a 0.2% annual risk of stroke. The patient's score is based upon: CHF History: 0 HTN History: 0 Diabetes History: 0 Stroke History: 0 Vascular Disease History: 0 Age Score: 0 Gender Score: 0             Physical Exam:   VS:  BP 118/68 (BP Location: Left Arm, Patient Position: Sitting, Cuff Size: Large)   Pulse 100   Ht 6' (1.829 m)   Wt 190 lb (86.2 kg)   SpO2 98%   BMI 25.77 kg/m    Wt Readings from Last 3 Encounters:  11/28/23 190 lb (86.2 kg)  09/29/23 192 lb 9.6 oz (87.4 kg)  09/25/23 191 lb 12.8 oz (87 kg)      GEN: Well nourished, well developed in no acute distress NECK: No JVD; No carotid bruits CARDIAC: Regular rate and rhythm, no murmurs, rubs, gallops RESPIRATORY:  Clear to auscultation without rales, wheezing or rhonchi  ABDOMEN: Soft, non-tender, non-distended EXTREMITIES:  No edema; No deformity   ASSESSMENT AND PLAN:    1.  Paroxysmal atrial fibrillation: Post ablation 10/15/2021.  Has had no further episodes of atrial fibrillation.  Has been feeling well and able to do all of his daily activities.  No changes.  2.  Atrial tachycardia: Has had heart rates in the 90s to low 100s.  Minimally symptomatic.  As he has not had any further arrhythmias, we [facility] Worlds see him back on an as-needed basis.  His primary physician can prescribe him metoprolol .  If he has any further issues, he can be either referred back or he can call us  for a new appointment.  Follow up with Dr. Lawana Pray as needed   Signed, Shatasha Lambing Cortland Ding, MD

## 2023-11-28 NOTE — Patient Instructions (Signed)
 Medication Instructions:  Your physician recommends that you continue on your current medications as directed. Please refer to the Current Medication list given to you today.  *If you need a refill on your cardiac medications before your next appointment, please call your pharmacy*  Lab Work: None ordered   Testing/Procedures: None ordered  Follow-Up: At [facility], you and your health needs are our priority.  As part of our continuing mission to provide you with exceptional heart care, our providers are all part of one team.  This team includes your primary Cardiologist (physician) and Advanced Practice Providers or APPs (Physician Assistants and Nurse Practitioners) who all work together to provide you with the care you need, when you need it.  Your next appointment:   as needed  Provider:   Agatha Horsfall, MD     Thank you for choosing [facility]!!   Reece Cane, RN [phone]

## 2023-12-11 NOTE — Addendum Note (Signed)
 Addended by: KATRINKA GARNETTE KIDD on: 12/11/2023 05:26 PM   Modules accepted: Orders

## 2023-12-13 ENCOUNTER — Other Ambulatory Visit (INDEPENDENT_AMBULATORY_CARE_PROVIDER_SITE_OTHER)

## 2023-12-13 DIAGNOSIS — R972 Elevated prostate specific antigen [PSA]: Secondary | ICD-10-CM

## 2023-12-14 LAB — PSA: PSA: 2.39 ng/mL (ref 0.10–4.00)

## 2023-12-19 ENCOUNTER — Ambulatory Visit: Payer: Self-pay | Admitting: Family Medicine

## 2023-12-20 ENCOUNTER — Other Ambulatory Visit: Payer: Self-pay

## 2023-12-20 DIAGNOSIS — R972 Elevated prostate specific antigen [PSA]: Secondary | ICD-10-CM

## 2024-02-25 ENCOUNTER — Other Ambulatory Visit: Payer: Self-pay | Admitting: Cardiology

## 2024-02-25 DIAGNOSIS — I480 Paroxysmal atrial fibrillation: Secondary | ICD-10-CM

## 2024-04-24 ENCOUNTER — Encounter: Payer: Self-pay | Admitting: Family Medicine

## 2024-09-19 ENCOUNTER — Encounter: Admitting: Family Medicine

## 2024-10-07 ENCOUNTER — Encounter: Admitting: Family Medicine
